# Patient Record
Sex: Male | Born: 1950 | Race: Black or African American | Hispanic: No | Marital: Single | Smoking: Never smoker
Health system: Southern US, Community
[De-identification: ages and names within clinical notes are randomized; demographics above are authoritative.]

## PROBLEM LIST (undated history)

## (undated) DIAGNOSIS — E119 Type 2 diabetes mellitus without complications: Secondary | ICD-10-CM

## (undated) DIAGNOSIS — Z789 Other specified health status: Secondary | ICD-10-CM

## (undated) DIAGNOSIS — M549 Dorsalgia, unspecified: Secondary | ICD-10-CM

## (undated) DIAGNOSIS — I10 Essential (primary) hypertension: Secondary | ICD-10-CM

## (undated) DIAGNOSIS — S069X9A Unspecified intracranial injury with loss of consciousness of unspecified duration, initial encounter: Secondary | ICD-10-CM

## (undated) HISTORY — DX: Essential (primary) hypertension: I10

## (undated) HISTORY — DX: Type 2 diabetes mellitus without complications: E11.9

---
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
## 2005-07-23 ENCOUNTER — Inpatient Hospital Stay (HOSPITAL_COMMUNITY): Admission: EM | Admit: 2005-07-23 | Discharge: 2005-07-28 | Payer: Self-pay | Admitting: Emergency Medicine

## 2008-08-14 ENCOUNTER — Emergency Department (HOSPITAL_COMMUNITY): Admission: EM | Admit: 2008-08-14 | Discharge: 2008-08-14 | Payer: Self-pay | Admitting: Emergency Medicine

## 2011-02-16 NOTE — Discharge Summary (Signed)
NAMERONEL, RODEHEAVER NO.:  [id number]   MEDICAL RECORD NO.:  [id number]          PATIENT TYPE:  INP   LOCATION:  3004                         FACILITY:  [facility]   PHYSICIAN:  Cherylynn Ridges, M.D.    DATE OF BIRTH:  [birth year]   DATE OF ADMISSION:  07/23/2005  DATE OF DISCHARGE:  07/28/2005                                 DISCHARGE SUMMARY   DISCHARGE DIAGNOSES:  1.  Blunt abdominal trauma.  2.  Grade 1 liver laceration.  3.  L2 left transverse process fracture.   CONSULTANTS:  None.   PROCEDURES:  None.   HISTORY OF PRESENT ILLNESS:  This is a 60 year old black male who got pinned  a wall and industrial washing machine while at work.  There was some period  of time before he was able to have the washing machine removed.  He comes in  as a non-trauma code complaining of abdominal pain.   WORKUP:  Workup demonstrated a grade 1 liver laceration with minimal free  fluid noted.  He also had an L2 transverse process fracture on the left  side.  He was admitted for pain control and observation of his hemoglobin  and hematocrit.  He was placed on bedrest.   HOSPITAL COURSE:  The patient did well in the hospital and his hemoglobin  remained stable.  He was able to be mobilized and ambulated without  assistance prior to discharge.  He was taking regular food at that point and  discharged in good condition to home.      Earney Hamburg, P.A.      Cherylynn Ridges, M.D.  Electronically Signed    MJ/MEDQ  D:  09/20/2005  T:  09/22/2005  Job:  [id number]

## 2011-07-03 LAB — CBC
HCT: 42.9
Hemoglobin: 14.3
MCHC: 33.3
MCV: 81.6
Platelets: 209
RBC: 5.26
RDW: 14.6
WBC: 4.3

## 2011-07-03 LAB — URINALYSIS, ROUTINE W REFLEX MICROSCOPIC
Bilirubin Urine: NEGATIVE
Glucose, UA: NEGATIVE
Hgb urine dipstick: NEGATIVE
Ketones, ur: NEGATIVE
Nitrite: NEGATIVE
Protein, ur: NEGATIVE
Specific Gravity, Urine: 1.019
Urobilinogen, UA: 0.2
pH: 5

## 2011-07-03 LAB — POCT I-STAT, CHEM 8
BUN: 10
Calcium, Ion: 1.11 — ABNORMAL LOW
Chloride: 106
Creatinine, Ser: 1.4
Glucose, Bld: 118 — ABNORMAL HIGH
HCT: 44
Hemoglobin: 15
Potassium: 4.2
Sodium: 140
TCO2: 26

## 2011-07-03 LAB — DIFFERENTIAL
Basophils Absolute: 0
Basophils Relative: 0
Eosinophils Absolute: 0.2
Eosinophils Relative: 4
Lymphocytes Relative: 34
Lymphs Abs: 1.5
Monocytes Absolute: 0.2
Monocytes Relative: 6
Neutro Abs: 2.4
Neutrophils Relative %: 56

## 2014-02-10 ENCOUNTER — Emergency Department (HOSPITAL_COMMUNITY): Payer: Worker's Compensation

## 2014-02-10 ENCOUNTER — Encounter (HOSPITAL_COMMUNITY): Payer: Self-pay | Admitting: Emergency Medicine

## 2014-02-10 ENCOUNTER — Emergency Department (HOSPITAL_COMMUNITY)
Admission: EM | Admit: 2014-02-10 | Discharge: 2014-02-10 | Disposition: A | Discharge status: Home / Self Care | Payer: Worker's Compensation | Attending: Emergency Medicine | Admitting: Emergency Medicine

## 2014-02-10 DIAGNOSIS — S46909A Unspecified injury of unspecified muscle, fascia and tendon at shoulder and upper arm level, unspecified arm, initial encounter: Secondary | ICD-10-CM | POA: Insufficient documentation

## 2014-02-10 DIAGNOSIS — Y990 Civilian activity done for income or pay: Secondary | ICD-10-CM | POA: Insufficient documentation

## 2014-02-10 DIAGNOSIS — W010XXA Fall on same level from slipping, tripping and stumbling without subsequent striking against object, initial encounter: Secondary | ICD-10-CM | POA: Insufficient documentation

## 2014-02-10 DIAGNOSIS — Y9389 Activity, other specified: Secondary | ICD-10-CM | POA: Insufficient documentation

## 2014-02-10 DIAGNOSIS — Y9289 Other specified places as the place of occurrence of the external cause: Secondary | ICD-10-CM | POA: Insufficient documentation

## 2014-02-10 DIAGNOSIS — S4980XA Other specified injuries of shoulder and upper arm, unspecified arm, initial encounter: Secondary | ICD-10-CM | POA: Insufficient documentation

## 2014-02-10 DIAGNOSIS — M25512 Pain in left shoulder: Secondary | ICD-10-CM

## 2014-02-10 HISTORY — DX: Dorsalgia, unspecified: M54.9

## 2014-02-10 MED ORDER — MELOXICAM 7.5 MG PO TABS: 15.0000 mg | ORAL_TABLET | Freq: Every day | ORAL | Status: DC

## 2014-02-10 MED ORDER — KETOROLAC TROMETHAMINE 60 MG/2ML IM SOLN
60.0000 mg | Freq: Once | INTRAMUSCULAR | Status: DC
  Filled 2014-02-10: qty 2

## 2014-02-10 MED ORDER — HYDROCODONE-ACETAMINOPHEN 5-325 MG PO TABS: 1.0000 | ORAL_TABLET | Freq: Four times a day (QID) | ORAL | Status: DC | PRN

## 2014-02-10 NOTE — ED Notes (Signed)
Pt states that he injured his L shoulder today at work. Was seen at [facility] and was told to come here because he had a "knot" on his shoulder. Alert and oriented.

## 2014-02-10 NOTE — ED Provider Notes (Signed)
CSN: [id number]     Arrival date & time 02/10/14  1517 History  This chart was scribed for non-physician practitioner, Junius FinnerErin O'Malley, PA-C working with Celene KrasJon R Knapp, MD by Luisa DagoPriscilla Tutu, ED scribe. This patient was seen in room WTR7/WTR7 and the patient's care was started at 4:07 PM.    Chief Complaint  Patient presents with  . Shoulder Injury   The history is provided by the patient. No language interpreter was used.   HPI Comments: Brandon Sullivan is a 63 y.o. male who presents to the Emergency Department complaining of an injury that occurred today at work. He states that he tripped over something and fell unto his left side. Pt states that he was seen by [facility] earlier who told him to come to the ED to be rechecked. He [facility]. Pt is right hand dominant. He states that he is still experiencing pain which he describes it as feeling "sore". He is currently in a sling. Denies any medicinal allergies, LOC, or head trauma. No pain medication PTA.  [facility] was consulted to confirm recheck of pt.  [facility] stated they had limited views with plain film and were concerned for subluxation of left shoulder.  Past Medical History  Diagnosis Date  . Back pain    History reviewed. No pertinent past surgical history. No family history on file. History  Substance Use Topics  . Smoking status: Never Smoker   . Smokeless tobacco: Not on file  . Alcohol Use: Yes    Review of Systems  Musculoskeletal: Positive for arthralgias (left shoulder pain).  All other systems reviewed and are negative.  Allergies  Review of patient's allergies indicates no known allergies.  Home Medications   Prior to Admission medications   Not on File   Triage vitals:BP 169/100  Pulse 103  Temp(Src) 99.1 F (37.3 C) (Oral)  SpO2 98%  Physical Exam  Nursing note and vitals reviewed. Constitutional: He appears well-developed and well-nourished.  HENT:   Head: Normocephalic and atraumatic.  Eyes: Conjunctivae are normal. No scleral icterus.  Neck: Normal range of motion.  Cardiovascular: Normal rate, regular rhythm and normal heart sounds.   Pulmonary/Chest: Effort normal and breath sounds normal. No respiratory distress. He has no wheezes. He has no rales. He exhibits no tenderness.  Musculoskeletal: Normal range of motion. He exhibits tenderness.  Mild deformity of left anterior shoulder with tenderness to palpation. Decrease abduction of left shoulder to 45 degrees. FROM left elbow without tenderness. 5/5 grip strength bilaterally.   Neurological: He is alert.  Skin: Skin is warm and dry.    ED Course  Procedures (including critical care time)  DIAGNOSTIC STUDIES: Oxygen Saturation is 98% on RA, normal by my interpretation.    COORDINATION OF CARE: 4:13 PM- Will order X-Ray of left shoulder. Pt advised of plan for treatment and pt agrees.  Imaging Review Dg Shoulder Left  02/10/2014   CLINICAL DATA:  Left shoulder pain.  EXAM: LEFT SHOULDER - 2+ VIEW  COMPARISON:  07/23/2005  FINDINGS: Left shoulder is located. The visualized left ribs are intact. No evidence for an acute fracture. [facility].  IMPRESSION: No acute bone abnormality to the left shoulder.   Electronically Signed   By: Richarda OverlieAdam  Henn M.D.   On: 02/10/2014 16:44   MDM   Final diagnoses:  Left shoulder pain  Fall due to stumbling    Pt c/o left shoulder pain, mild  deformity and limited ROM due to pain. Left arm is neurovascuarlly in tact. Repeat plain films of left shoulder-no acute bone abnormality to left shoulder. No evidence of acute fracture or subluxation.  Will tx as sprain. Pt may have injury to rotator cuff. Advised pt to continue using sling. [facility] for further evaluation and tx of left shoulder if not improving in 1-2 weeks. Return precautions provided. Pt verbalized understanding and agreement with tx  plan.   I personally performed the services described in this documentation, which was scribed in my presence. The recorded information has been reviewed and is accurate.     Junius Finnerrin O'Malley, PA-C 02/10/14 1705

## 2014-02-10 NOTE — Discharge Instructions (Signed)
Acromioclavicular Injuries °The acromioclavicular (AC) joint is the joint in the shoulder. There are many bands of tissue (ligaments) that surround the AC bones and joints. These bands of tissue can tear, which can lead to sprains and separations. The bones of the AC joint can also break (fracture).  °HOME CARE  °· Put ice on the injured area. °· Put ice in a plastic bag. °· Place a towel between your skin and the bag. °· Leave the ice on for 15-20 minutes, 03-04 times a day. °· Wear your sling as told by your doctor. Remove the sling before showering and bathing. Keep the shoulder in the same place as when the sling is on. Do not lift the arm. °· Gently tighten your figure-eight splint (if applied) every day. Tighten it enough to keep the shoulders held back. There should be room to place your finger between your body and the strap. Loosen the splint right away if you lose feeling (numbness) or have tingling in your hands. °· Only take medicine as told by your doctor. °· Keep all follow-up visits with your doctor. °GET HELP RIGHT AWAY IF:  °· Your medicine does not help your pain. °· You have more puffiness (swelling) or your bruising gets worse rather than better. °· You were unable to follow up as told by your doctor. °· You have tingling or lose even more feeling in your arm, forearm, or hand. °· Your arm is cold or pale. °· You have more pain in the hand, forearm, or fingers. °MAKE SURE YOU:  °· Understand these instructions. °· Will watch your condition. °· Will get help right away if you are not doing well or get worse. °Document Released: 03/07/2010 Document Revised: 12/10/2011 Document Reviewed: 03/07/2010 °[employer]® Patient Information ©2014 [employer]. ° °

## 2014-02-11 NOTE — ED Provider Notes (Signed)
Medical screening examination/treatment/procedure(s) were performed by non-physician practitioner and as supervising physician I was immediately available for consultation/collaboration.   Celene KrasJon R Adith Tejada, MD 02/11/14 [phone]

## 2014-09-15 ENCOUNTER — Emergency Department (HOSPITAL_COMMUNITY)
Admission: EM | Admit: 2014-09-15 | Discharge: 2014-09-15 | Disposition: A | Discharge status: Home / Self Care | Payer: BC Managed Care – HMO | Attending: Emergency Medicine | Admitting: Emergency Medicine

## 2014-09-15 ENCOUNTER — Encounter (HOSPITAL_COMMUNITY): Payer: Self-pay | Admitting: Emergency Medicine

## 2014-09-15 DIAGNOSIS — Z791 Long term (current) use of non-steroidal anti-inflammatories (NSAID): Secondary | ICD-10-CM | POA: Diagnosis not present

## 2014-09-15 DIAGNOSIS — Y9289 Other specified places as the place of occurrence of the external cause: Secondary | ICD-10-CM | POA: Insufficient documentation

## 2014-09-15 DIAGNOSIS — Z79899 Other long term (current) drug therapy: Secondary | ICD-10-CM | POA: Diagnosis not present

## 2014-09-15 DIAGNOSIS — X58XXXA Exposure to other specified factors, initial encounter: Secondary | ICD-10-CM | POA: Insufficient documentation

## 2014-09-15 DIAGNOSIS — Y9389 Activity, other specified: Secondary | ICD-10-CM | POA: Diagnosis not present

## 2014-09-15 DIAGNOSIS — Y990 Civilian activity done for income or pay: Secondary | ICD-10-CM | POA: Diagnosis not present

## 2014-09-15 DIAGNOSIS — M5412 Radiculopathy, cervical region: Secondary | ICD-10-CM | POA: Diagnosis not present

## 2014-09-15 DIAGNOSIS — S199XXA Unspecified injury of neck, initial encounter: Secondary | ICD-10-CM | POA: Diagnosis present

## 2014-09-15 MED ORDER — METHOCARBAMOL 500 MG PO TABS: 500.0000 mg | ORAL_TABLET | Freq: Two times a day (BID) | ORAL | Status: DC

## 2014-09-15 MED ORDER — PREDNISONE 10 MG PO TABS: 20.0000 mg | ORAL_TABLET | Freq: Every day | ORAL | Status: DC

## 2014-09-15 NOTE — Discharge Instructions (Signed)

## 2014-09-15 NOTE — ED Notes (Signed)
Bed: [facility] Expected date:  Expected time:  Means of arrival:  Comments: 

## 2014-09-15 NOTE — ED Provider Notes (Signed)
CSN: [id number]     Arrival date & time 09/15/14  1024 History   First MD Initiated Contact with Patient 09/15/14 1117     Chief Complaint  Patient presents with  . Neck Pain     (Consider location/radiation/quality/duration/timing/severity/associated sxs/prior Treatment) HPI Comments: Patient here complaining of left-sided neck pain 3 days. Pain started after he lifted a heavy object at work. Pain shoots down from his neck down his arm. Denies any anginal type symptoms. Symptoms characterized as sharp and worse with movement. They're better with rest. No treatment used prior to arrival. Denies any prior history of degenerative disc disease.  Patient is a 63 y.o. male presenting with neck pain. The history is provided by the patient.  Neck Pain   Past Medical History  Diagnosis Date  . Back pain    History reviewed. No pertinent past surgical history. No family history on file. History  Substance Use Topics  . Smoking status: Never Smoker   . Smokeless tobacco: Not on file  . Alcohol Use: Yes    Review of Systems  Musculoskeletal: Positive for neck pain.  All other systems reviewed and are negative.     Allergies  Review of patient's allergies indicates no known allergies.  Home Medications   Prior to Admission medications   Medication Sig Start Date End Date Taking? Authorizing Provider  HYDROcodone-acetaminophen (NORCO/VICODIN) 5-325 MG per tablet Take 1-2 tablets by mouth every 6 (six) hours as needed. 02/10/14   Junius FinnerErin O'Malley, PA-C  meloxicam (MOBIC) 7.5 MG tablet Take 2 tablets (15 mg total) by mouth daily. 02/10/14   Junius FinnerErin O'Malley, PA-C  methocarbamol (ROBAXIN) 500 MG tablet Take 1 tablet (500 mg total) by mouth 2 (two) times daily. 09/15/14   Toy BakerAnthony T Berkeley Vanaken, MD  predniSONE (DELTASONE) 10 MG tablet Take 2 tablets (20 mg total) by mouth daily. 09/15/14   Toy BakerAnthony T Ladajah Soltys, MD   BP 167/78 mmHg  Pulse 83  Temp(Src) 97.7 F (36.5 C) (Oral)  Resp 16  SpO2  100% Physical Exam  Constitutional: He is oriented to person, place, and time. He appears well-developed and well-nourished.  Non-toxic appearance. No distress.  HENT:  Head: Normocephalic and atraumatic.  Eyes: Conjunctivae, EOM and lids are normal. Pupils are equal, round, and reactive to light.  Neck: Normal range of motion. Neck supple. Muscular tenderness present. No tracheal deviation present. No thyroid mass present.    Cardiovascular: Normal rate, regular rhythm and normal heart sounds.  Exam reveals no gallop.   No murmur heard. Pulmonary/Chest: Effort normal and breath sounds normal. No stridor. No respiratory distress. He has no decreased breath sounds. He has no wheezes. He has no rhonchi. He has no rales.  Abdominal: Soft. Normal appearance and bowel sounds are normal. He exhibits no distension. There is no tenderness. There is no rebound and no CVA tenderness.  Musculoskeletal: Normal range of motion. He exhibits no edema or tenderness.  Neurological: He is alert and oriented to person, place, and time. He has normal strength. No cranial nerve deficit or sensory deficit. GCS eye subscore is 4. GCS verbal subscore is 5. GCS motor subscore is 6.  Median ulnar and radial nerves intact  Skin: Skin is warm and dry. No abrasion and no rash noted.  Psychiatric: He has a normal mood and affect. His speech is normal and behavior is normal.  Nursing note and vitals reviewed.   ED Course  Procedures (including critical care time) Labs Review Labs Reviewed - No data to  display  Imaging Review No results found.   EKG Interpretation None      MDM   Final diagnoses:  Cervical radiculopathy    Patient with likely cervical radiculopathy. Stable for discharge    Toy BakerAnthony T Hershey Knauer, MD 09/15/14 [phone]

## 2014-09-15 NOTE — ED Notes (Signed)
Per pt, states left neck pain that radiates down left arm, states injury form work

## 2018-12-31 DIAGNOSIS — S069X9A Unspecified intracranial injury with loss of consciousness of unspecified duration, initial encounter: Secondary | ICD-10-CM

## 2018-12-31 DIAGNOSIS — S069XAA Unspecified intracranial injury with loss of consciousness status unknown, initial encounter: Secondary | ICD-10-CM

## 2018-12-31 HISTORY — DX: Unspecified intracranial injury with loss of consciousness of unspecified duration, initial encounter: S06.9X9A

## 2018-12-31 HISTORY — DX: Unspecified intracranial injury with loss of consciousness status unknown, initial encounter: S06.9XAA

## 2019-01-26 ENCOUNTER — Emergency Department (HOSPITAL_COMMUNITY): Payer: Managed Care, Other (non HMO)

## 2019-01-26 ENCOUNTER — Encounter (HOSPITAL_COMMUNITY): Payer: Self-pay

## 2019-01-26 ENCOUNTER — Inpatient Hospital Stay (HOSPITAL_COMMUNITY)
Admission: EM | Admit: 2019-01-26 | Discharge: 2019-02-10 | DRG: 083 | Disposition: A | Discharge status: Skilled Nursing Facility | Payer: Managed Care, Other (non HMO) | Attending: General Surgery | Admitting: General Surgery

## 2019-01-26 DIAGNOSIS — Y901 Blood alcohol level of 20-39 mg/100 ml: Secondary | ICD-10-CM | POA: Diagnosis present

## 2019-01-26 DIAGNOSIS — H5010 Unspecified exotropia: Secondary | ICD-10-CM | POA: Diagnosis present

## 2019-01-26 DIAGNOSIS — S065X9A Traumatic subdural hemorrhage with loss of consciousness of unspecified duration, initial encounter: Secondary | ICD-10-CM | POA: Diagnosis present

## 2019-01-26 DIAGNOSIS — S1093XA Contusion of unspecified part of neck, initial encounter: Secondary | ICD-10-CM | POA: Diagnosis present

## 2019-01-26 DIAGNOSIS — F10129 Alcohol abuse with intoxication, unspecified: Secondary | ICD-10-CM | POA: Diagnosis present

## 2019-01-26 DIAGNOSIS — R40243 Glasgow coma scale score 3-8, unspecified time: Secondary | ICD-10-CM | POA: Diagnosis present

## 2019-01-26 DIAGNOSIS — Z781 Physical restraint status: Secondary | ICD-10-CM | POA: Diagnosis not present

## 2019-01-26 DIAGNOSIS — Z20828 Contact with and (suspected) exposure to other viral communicable diseases: Secondary | ICD-10-CM | POA: Diagnosis present

## 2019-01-26 DIAGNOSIS — Z1159 Encounter for screening for other viral diseases: Secondary | ICD-10-CM | POA: Diagnosis not present

## 2019-01-26 DIAGNOSIS — R000 Tachycardia, unspecified: Secondary | ICD-10-CM | POA: Diagnosis present

## 2019-01-26 DIAGNOSIS — R451 Restlessness and agitation: Secondary | ICD-10-CM | POA: Diagnosis present

## 2019-01-26 DIAGNOSIS — R4701 Aphasia: Secondary | ICD-10-CM | POA: Diagnosis present

## 2019-01-26 DIAGNOSIS — S60512A Abrasion of left hand, initial encounter: Secondary | ICD-10-CM | POA: Diagnosis present

## 2019-01-26 DIAGNOSIS — S0083XA Contusion of other part of head, initial encounter: Secondary | ICD-10-CM | POA: Diagnosis present

## 2019-01-26 DIAGNOSIS — S80211A Abrasion, right knee, initial encounter: Secondary | ICD-10-CM | POA: Diagnosis present

## 2019-01-26 DIAGNOSIS — S80212A Abrasion, left knee, initial encounter: Secondary | ICD-10-CM | POA: Diagnosis present

## 2019-01-26 DIAGNOSIS — R410 Disorientation, unspecified: Secondary | ICD-10-CM | POA: Diagnosis present

## 2019-01-26 DIAGNOSIS — I10 Essential (primary) hypertension: Secondary | ICD-10-CM | POA: Diagnosis present

## 2019-01-26 DIAGNOSIS — S60511A Abrasion of right hand, initial encounter: Secondary | ICD-10-CM | POA: Diagnosis present

## 2019-01-26 DIAGNOSIS — S066X9A Traumatic subarachnoid hemorrhage with loss of consciousness of unspecified duration, initial encounter: Secondary | ICD-10-CM | POA: Diagnosis present

## 2019-01-26 HISTORY — DX: Other specified health status: Z78.9

## 2019-01-26 LAB — ABO/RH: ABO/RH(D): A POS

## 2019-01-26 LAB — BPAM FFP
Blood Product Expiration Date: 202005092359
Blood Product Expiration Date: 202005172359
ISSUE DATE / TIME: 202004271010
ISSUE DATE / TIME: 202004271010
Unit Type and Rh: 600
Unit Type and Rh: 6200

## 2019-01-26 LAB — CBC
HCT: 44.9 % (ref 39.0–52.0)
Hemoglobin: 15.3 g/dL (ref 13.0–17.0)
MCH: 27.1 pg (ref 26.0–34.0)
MCHC: 34.1 g/dL (ref 30.0–36.0)
MCV: 79.5 fL — ABNORMAL LOW (ref 80.0–100.0)
Platelets: 200 10*3/uL (ref 150–400)
RBC: 5.65 MIL/uL (ref 4.22–5.81)
RDW: 12.3 % (ref 11.5–15.5)
WBC: 7.4 10*3/uL (ref 4.0–10.5)
nRBC: 0 % (ref 0.0–0.2)

## 2019-01-26 LAB — RAPID URINE DRUG SCREEN, HOSP PERFORMED
Amphetamines: NOT DETECTED
Barbiturates: NOT DETECTED
Benzodiazepines: POSITIVE — AB
Cocaine: NOT DETECTED
Opiates: NOT DETECTED
Tetrahydrocannabinol: NOT DETECTED

## 2019-01-26 LAB — PREPARE FRESH FROZEN PLASMA
Unit division: 0
Unit division: 0

## 2019-01-26 LAB — MRSA PCR SCREENING: MRSA by PCR: NEGATIVE

## 2019-01-26 LAB — COMPREHENSIVE METABOLIC PANEL
ALT: 38 U/L (ref 0–44)
AST: 58 U/L — ABNORMAL HIGH (ref 15–41)
Albumin: 3.7 g/dL (ref 3.5–5.0)
Alkaline Phosphatase: 78 U/L (ref 38–126)
Anion gap: 14 (ref 5–15)
BUN: 13 mg/dL (ref 8–23)
CO2: 19 mmol/L — ABNORMAL LOW (ref 22–32)
Calcium: 9.1 mg/dL (ref 8.9–10.3)
Chloride: 101 mmol/L (ref 98–111)
Creatinine, Ser: 1.3 mg/dL — ABNORMAL HIGH (ref 0.61–1.24)
GFR calc Af Amer: >60 mL/min (ref >60)
GFR calc non Af Amer: 56 mL/min — ABNORMAL LOW (ref >60)
Glucose, Bld: 360 mg/dL — ABNORMAL HIGH (ref 70–99)
Potassium: 4.6 mmol/L (ref 3.5–5.1)
Sodium: 134 mmol/L — ABNORMAL LOW (ref 135–145)
Total Bilirubin: 1.1 mg/dL (ref 0.3–1.2)
Total Protein: 6.9 g/dL (ref 6.5–8.1)

## 2019-01-26 LAB — GLUCOSE, CAPILLARY
Glucose-Capillary: 325 mg/dL — ABNORMAL HIGH (ref 70–99)
Glucose-Capillary: 334 mg/dL — ABNORMAL HIGH (ref 70–99)
Glucose-Capillary: 301 mg/dL — ABNORMAL HIGH (ref 70–99)
Glucose-Capillary: 204 mg/dL — ABNORMAL HIGH (ref 70–99)

## 2019-01-26 LAB — PROTIME-INR
INR: 1 (ref 0.8–1.2)
Prothrombin Time: 13.1 seconds (ref 11.4–15.2)

## 2019-01-26 LAB — URINALYSIS, ROUTINE W REFLEX MICROSCOPIC
Bilirubin Urine: NEGATIVE
Glucose, UA: >=500 mg/dL — AB
Ketones, ur: NEGATIVE mg/dL
Leukocytes,Ua: NEGATIVE
Nitrite: NEGATIVE
Protein, ur: NEGATIVE mg/dL
Specific Gravity, Urine: 1.03 (ref 1.005–1.030)
pH: 5 (ref 5.0–8.0)

## 2019-01-26 LAB — SARS CORONAVIRUS 2 BY RT PCR (HOSPITAL ORDER, PERFORMED IN ~~LOC~~ HOSPITAL LAB): SARS Coronavirus 2: NEGATIVE

## 2019-01-26 LAB — LACTIC ACID, PLASMA: Lactic Acid, Venous: 2.8 mmol/L (ref 0.5–1.9)

## 2019-01-26 LAB — ETHANOL: Alcohol, Ethyl (B): 32 mg/dL — ABNORMAL HIGH (ref <10)

## 2019-01-26 LAB — CDS SEROLOGY

## 2019-01-26 MED ORDER — BACITRACIN ZINC 500 UNIT/GM EX OINT
TOPICAL_OINTMENT | Freq: Two times a day (BID) | CUTANEOUS | Status: DC
  Administered 2019-01-26 – 2019-01-27 (×3): via TOPICAL
  Administered 2019-01-27 – 2019-01-28 (×3): 1 via TOPICAL
  Administered 2019-01-29 – 2019-02-01 (×8): via TOPICAL
  Administered 2019-02-02 (×2): 1 via TOPICAL
  Administered 2019-02-03 – 2019-02-06 (×7): via TOPICAL
  Administered 2019-02-06: 1 via TOPICAL
  Administered 2019-02-07 – 2019-02-10 (×6): via TOPICAL
  Filled 2019-01-26 (×3): qty 28.4

## 2019-01-26 MED ORDER — MIDAZOLAM HCL 2 MG/2ML IJ SOLN
INTRAMUSCULAR | Status: AC
  Filled 2019-01-26: qty 2

## 2019-01-26 MED ORDER — HYDRALAZINE HCL 20 MG/ML IJ SOLN
10.0000 mg | INTRAMUSCULAR | Status: AC | PRN
  Administered 2019-01-27 (×4): 10 mg via INTRAVENOUS
  Filled 2019-01-26 (×3): qty 1

## 2019-01-26 MED ORDER — LORAZEPAM 2 MG/ML IJ SOLN
0.0000 mg | INTRAMUSCULAR | Status: AC
  Administered 2019-01-26: 1 mg via INTRAVENOUS
  Administered 2019-01-27 (×4): 2 mg via INTRAVENOUS
  Administered 2019-01-27 – 2019-01-28 (×2): 1 mg via INTRAVENOUS
  Administered 2019-01-28 (×3): 2 mg via INTRAVENOUS
  Administered 2019-01-28 (×2): 1 mg via INTRAVENOUS
  Administered 2019-01-28: 2 mg via INTRAVENOUS
  Administered 2019-01-29 (×4): 1 mg via INTRAVENOUS
  Administered 2019-01-30 (×4): 2 mg via INTRAVENOUS
  Administered 2019-01-30 (×2): 1 mg via INTRAVENOUS
  Administered 2019-01-31: 2 mg via INTRAVENOUS
  Filled 2019-01-26 (×24): qty 1

## 2019-01-26 MED ORDER — FOLIC ACID 1 MG PO TABS
1.0000 mg | ORAL_TABLET | Freq: Every day | ORAL | Status: DC
  Administered 2019-01-27 – 2019-02-10 (×10): 1 mg via ORAL
  Filled 2019-01-26 (×10): qty 1

## 2019-01-26 MED ORDER — ACETAMINOPHEN 325 MG PO TABS: 650.0000 mg | ORAL_TABLET | Freq: Four times a day (QID) | ORAL | Status: DC | PRN

## 2019-01-26 MED ORDER — METOPROLOL TARTRATE 5 MG/5ML IV SOLN
INTRAVENOUS | Status: AC
  Filled 2019-01-26: qty 5

## 2019-01-26 MED ORDER — LORAZEPAM 1 MG PO TABS: 1.0000 mg | ORAL_TABLET | ORAL | Status: DC | PRN

## 2019-01-26 MED ORDER — MORPHINE SULFATE (PF) 2 MG/ML IV SOLN
1.0000 mg | INTRAVENOUS | Status: DC | PRN
  Administered 2019-01-27 – 2019-01-28 (×2): 1 mg via INTRAVENOUS
  Administered 2019-01-28 – 2019-02-09 (×4): 2 mg via INTRAVENOUS
  Filled 2019-01-26 (×6): qty 1

## 2019-01-26 MED ORDER — OXYCODONE HCL 5 MG PO TABS
5.0000 mg | ORAL_TABLET | ORAL | Status: DC | PRN
  Filled 2019-01-26: qty 1

## 2019-01-26 MED ORDER — LORAZEPAM 2 MG/ML IJ SOLN: 0.0000 mg | Freq: Two times a day (BID) | INTRAMUSCULAR | Status: DC

## 2019-01-26 MED ORDER — ONDANSETRON HCL 4 MG/2ML IJ SOLN: 4.0000 mg | Freq: Four times a day (QID) | INTRAMUSCULAR | Status: DC | PRN

## 2019-01-26 MED ORDER — ONDANSETRON 4 MG PO TBDP: 4.0000 mg | ORAL_TABLET | Freq: Four times a day (QID) | ORAL | Status: DC | PRN

## 2019-01-26 MED ORDER — METOPROLOL TARTRATE 5 MG/5ML IV SOLN
5.0000 mg | Freq: Four times a day (QID) | INTRAVENOUS | Status: DC | PRN
  Administered 2019-01-26 – 2019-01-30 (×11): 5 mg via INTRAVENOUS
  Filled 2019-01-26 (×8): qty 5

## 2019-01-26 MED ORDER — PANTOPRAZOLE SODIUM 40 MG IV SOLR
40.0000 mg | Freq: Every day | INTRAVENOUS | Status: DC
  Administered 2019-01-27 – 2019-02-02 (×6): 40 mg via INTRAVENOUS
  Filled 2019-01-26 (×7): qty 40

## 2019-01-26 MED ORDER — LORAZEPAM 1 MG PO TABS: 1.0000 mg | ORAL_TABLET | Freq: Four times a day (QID) | ORAL | Status: DC | PRN

## 2019-01-26 MED ORDER — CHLORHEXIDINE GLUCONATE 0.12 % MT SOLN
15.0000 mL | Freq: Two times a day (BID) | OROMUCOSAL | Status: DC
  Administered 2019-01-26 – 2019-02-10 (×27): 15 mL via OROMUCOSAL
  Filled 2019-01-26 (×23): qty 15

## 2019-01-26 MED ORDER — INSULIN ASPART 100 UNIT/ML ~~LOC~~ SOLN
0.0000 [IU] | SUBCUTANEOUS | Status: DC
  Administered 2019-01-26: 5 [IU] via SUBCUTANEOUS
  Administered 2019-01-26 (×2): 11 [IU] via SUBCUTANEOUS
  Administered 2019-01-27: 3 [IU] via SUBCUTANEOUS
  Administered 2019-01-27: 5 [IU] via SUBCUTANEOUS
  Administered 2019-01-27 (×2): 3 [IU] via SUBCUTANEOUS
  Administered 2019-01-27: 5 [IU] via SUBCUTANEOUS
  Administered 2019-01-27 – 2019-01-28 (×2): 3 [IU] via SUBCUTANEOUS
  Administered 2019-01-28: 08:00:00 2 [IU] via SUBCUTANEOUS
  Administered 2019-01-28: 3 [IU] via SUBCUTANEOUS
  Administered 2019-01-28: 2 [IU] via SUBCUTANEOUS
  Administered 2019-01-28 – 2019-01-29 (×6): 3 [IU] via SUBCUTANEOUS
  Administered 2019-01-29: 2 [IU] via SUBCUTANEOUS
  Administered 2019-01-29 – 2019-01-30 (×6): 3 [IU] via SUBCUTANEOUS
  Administered 2019-01-30: 2 [IU] via SUBCUTANEOUS
  Administered 2019-01-31: 3 [IU] via SUBCUTANEOUS
  Administered 2019-01-31: 5 [IU] via SUBCUTANEOUS
  Administered 2019-01-31: 8 [IU] via SUBCUTANEOUS
  Administered 2019-01-31 (×2): 11 [IU] via SUBCUTANEOUS
  Administered 2019-01-31: 3 [IU] via SUBCUTANEOUS
  Administered 2019-02-01: 5 [IU] via SUBCUTANEOUS
  Administered 2019-02-01: 3 [IU] via SUBCUTANEOUS
  Administered 2019-02-01 (×2): 5 [IU] via SUBCUTANEOUS
  Administered 2019-02-01: 11 [IU] via SUBCUTANEOUS
  Administered 2019-02-01 – 2019-02-02 (×5): 5 [IU] via SUBCUTANEOUS
  Administered 2019-02-02 – 2019-02-03 (×2): 3 [IU] via SUBCUTANEOUS
  Administered 2019-02-03 (×2): 5 [IU] via SUBCUTANEOUS
  Administered 2019-02-03: 3 [IU] via SUBCUTANEOUS
  Administered 2019-02-03: 8 [IU] via SUBCUTANEOUS
  Administered 2019-02-03: 3 [IU] via SUBCUTANEOUS
  Administered 2019-02-04: 2 [IU] via SUBCUTANEOUS
  Administered 2019-02-04: 3 [IU] via SUBCUTANEOUS
  Administered 2019-02-04 – 2019-02-05 (×4): 5 [IU] via SUBCUTANEOUS
  Administered 2019-02-05: 17:00:00 3 [IU] via SUBCUTANEOUS
  Administered 2019-02-05: 2 [IU] via SUBCUTANEOUS
  Administered 2019-02-05: 8 [IU] via SUBCUTANEOUS
  Administered 2019-02-06 (×2): 2 [IU] via SUBCUTANEOUS
  Administered 2019-02-06 (×2): 3 [IU] via SUBCUTANEOUS

## 2019-01-26 MED ORDER — THIAMINE HCL 100 MG/ML IJ SOLN
100.0000 mg | Freq: Every day | INTRAMUSCULAR | Status: DC
  Administered 2019-01-26 – 2019-02-02 (×6): 100 mg via INTRAVENOUS
  Filled 2019-01-26 (×6): qty 2

## 2019-01-26 MED ORDER — SODIUM CHLORIDE 0.9 % IV SOLN
INTRAVENOUS | Status: DC
  Administered 2019-01-26 – 2019-02-01 (×10): via INTRAVENOUS

## 2019-01-26 MED ORDER — VITAMIN B-1 100 MG PO TABS
100.0000 mg | ORAL_TABLET | Freq: Every day | ORAL | Status: DC
  Administered 2019-01-31 – 2019-02-10 (×9): 100 mg via ORAL
  Filled 2019-01-26 (×9): qty 1

## 2019-01-26 MED ORDER — LORAZEPAM 2 MG/ML IJ SOLN
INTRAMUSCULAR | Status: AC
  Filled 2019-01-26: qty 1

## 2019-01-26 MED ORDER — ADULT MULTIVITAMIN W/MINERALS CH
1.0000 | ORAL_TABLET | Freq: Every day | ORAL | Status: DC
  Administered 2019-01-31 – 2019-02-10 (×9): 1 via ORAL
  Filled 2019-01-26 (×11): qty 1

## 2019-01-26 MED ORDER — LORAZEPAM 2 MG/ML IJ SOLN
0.0000 mg | Freq: Four times a day (QID) | INTRAMUSCULAR | Status: DC
  Administered 2019-01-26: 2 mg via INTRAVENOUS

## 2019-01-26 MED ORDER — LORAZEPAM 2 MG/ML IJ SOLN
1.0000 mg | INTRAMUSCULAR | Status: DC | PRN
  Administered 2019-01-26 – 2019-02-01 (×5): 1 mg via INTRAVENOUS
  Filled 2019-01-26 (×6): qty 1

## 2019-01-26 MED ORDER — MIDAZOLAM HCL 5 MG/5ML IJ SOLN
INTRAMUSCULAR | Status: AC | PRN
  Administered 2019-01-26: 1 mg via INTRAVENOUS

## 2019-01-26 MED ORDER — IOHEXOL 300 MG/ML  SOLN
100.0000 mL | Freq: Once | INTRAMUSCULAR | Status: AC | PRN
  Administered 2019-01-26: 11:00:00 100 mL via INTRAVENOUS

## 2019-01-26 MED ORDER — LORAZEPAM 2 MG/ML IJ SOLN: 1.0000 mg | Freq: Four times a day (QID) | INTRAMUSCULAR | Status: DC | PRN

## 2019-01-26 MED ORDER — HYDRALAZINE HCL 20 MG/ML IJ SOLN: 10.0000 mg | INTRAMUSCULAR | Status: DC | PRN

## 2019-01-26 MED ORDER — PANTOPRAZOLE SODIUM 40 MG PO TBEC
40.0000 mg | DELAYED_RELEASE_TABLET | Freq: Every day | ORAL | Status: DC
  Administered 2019-02-03 – 2019-02-10 (×8): 40 mg via ORAL
  Filled 2019-01-26 (×8): qty 1

## 2019-01-26 MED ORDER — TETANUS-DIPHTH-ACELL PERTUSSIS 5-2.5-18.5 LF-MCG/0.5 IM SUSP
0.5000 mL | Freq: Once | INTRAMUSCULAR | Status: AC
  Administered 2019-01-26: 0.5 mL via INTRAMUSCULAR
  Filled 2019-01-26: qty 0.5

## 2019-01-26 NOTE — Progress Notes (Signed)
SLP Cancellation Note  Patient Details Name: Brandon Sullivan MRN: [id number] DOB: 04/28/1951   Cancelled treatment:       Reason Eval/Treat Not Completed: Other (comment) Orders received for swallow evaluation. Touched base with RN, who advises holding until next date. Will f/u for swallow and cognitive evaluations.    Virl Axe Yanelle Sousa 01/26/2019, 3:47 PM  Ivar Drape, M.A. CCC-SLP Acute Herbalist [phone] Office [phone]
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
## 2019-01-26 NOTE — ED Notes (Signed)
Pt returned from CT with nursing staff. Remains stable.
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
## 2019-01-26 NOTE — Progress Notes (Addendum)
Attempted to call contact listed for patient Brandon Sullivan x2, no answer. No answering service for me to leave a message.   Wells Guiles , [facility] 01/26/2019, 3:00 PM Pager: [phone]
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
## 2019-01-26 NOTE — ED Notes (Signed)
Attempted report 

## 2019-01-27 ENCOUNTER — Inpatient Hospital Stay (HOSPITAL_COMMUNITY): Payer: Managed Care, Other (non HMO)

## 2019-01-27 LAB — BPAM RBC
Blood Product Expiration Date: 202005102359
Blood Product Expiration Date: 202005102359
ISSUE DATE / TIME: 202004272326
ISSUE DATE / TIME: 202004272326
Unit Type and Rh: 5100
Unit Type and Rh: 5100

## 2019-01-27 LAB — TYPE AND SCREEN
ABO/RH(D): A POS
Antibody Screen: NEGATIVE
Unit division: 0
Unit division: 0

## 2019-01-27 LAB — GLUCOSE, CAPILLARY
Glucose-Capillary: 167 mg/dL — ABNORMAL HIGH (ref 70–99)
Glucose-Capillary: 180 mg/dL — ABNORMAL HIGH (ref 70–99)
Glucose-Capillary: 181 mg/dL — ABNORMAL HIGH (ref 70–99)
Glucose-Capillary: 182 mg/dL — ABNORMAL HIGH (ref 70–99)
Glucose-Capillary: 188 mg/dL — ABNORMAL HIGH (ref 70–99)
Glucose-Capillary: 202 mg/dL — ABNORMAL HIGH (ref 70–99)
Glucose-Capillary: 207 mg/dL — ABNORMAL HIGH (ref 70–99)

## 2019-01-27 LAB — COMPREHENSIVE METABOLIC PANEL
ALT: 50 U/L — ABNORMAL HIGH (ref 0–44)
AST: 97 U/L — ABNORMAL HIGH (ref 15–41)
Albumin: 3.2 g/dL — ABNORMAL LOW (ref 3.5–5.0)
Alkaline Phosphatase: 67 U/L (ref 38–126)
Anion gap: 11 (ref 5–15)
BUN: 12 mg/dL (ref 8–23)
CO2: 22 mmol/L (ref 22–32)
Calcium: 8.9 mg/dL (ref 8.9–10.3)
Chloride: 107 mmol/L (ref 98–111)
Creatinine, Ser: 0.99 mg/dL (ref 0.61–1.24)
GFR calc Af Amer: >60 mL/min (ref >60)
GFR calc non Af Amer: >60 mL/min (ref >60)
Glucose, Bld: 177 mg/dL — ABNORMAL HIGH (ref 70–99)
Potassium: 3.8 mmol/L (ref 3.5–5.1)
Sodium: 140 mmol/L (ref 135–145)
Total Bilirubin: 0.9 mg/dL (ref 0.3–1.2)
Total Protein: 6.2 g/dL — ABNORMAL LOW (ref 6.5–8.1)

## 2019-01-27 LAB — CBC
HCT: 37.2 % — ABNORMAL LOW (ref 39.0–52.0)
Hemoglobin: 12.7 g/dL — ABNORMAL LOW (ref 13.0–17.0)
MCH: 26.7 pg (ref 26.0–34.0)
MCHC: 34.1 g/dL (ref 30.0–36.0)
MCV: 78.2 fL — ABNORMAL LOW (ref 80.0–100.0)
Platelets: 159 10*3/uL (ref 150–400)
RBC: 4.76 MIL/uL (ref 4.22–5.81)
RDW: 12.2 % (ref 11.5–15.5)
WBC: 7 10*3/uL (ref 4.0–10.5)
nRBC: 0 % (ref 0.0–0.2)

## 2019-01-27 LAB — HIV ANTIBODY (ROUTINE TESTING W REFLEX): HIV Screen 4th Generation wRfx: NONREACTIVE

## 2019-01-27 LAB — BLOOD PRODUCT ORDER (VERBAL) VERIFICATION

## 2019-01-27 MED ORDER — MIDAZOLAM HCL 2 MG/2ML IJ SOLN: 2.0000 mg | Freq: Once | INTRAMUSCULAR | Status: DC
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
## 2019-01-27 NOTE — Progress Notes (Signed)
Pts HR in 130s. Per hand-off report @ 1600, pt has been in ST all day.  Pt does have order for lopressor PRN, however indicated for BP not HR.  Pt has recently received Ativan w/ no change in HR despite lying calmly in bed.  Spoke w/ Dr. Corliss Skains, Trauma who states ok to give pt ordered lopressor for HR.

## 2019-01-27 NOTE — Progress Notes (Signed)
Report given to Marchelle Folks, RN 4N. Pt to be transported by bed.
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
## 2019-01-28 LAB — GLUCOSE, CAPILLARY
Glucose-Capillary: 147 mg/dL — ABNORMAL HIGH (ref 70–99)
Glucose-Capillary: 147 mg/dL — ABNORMAL HIGH (ref 70–99)
Glucose-Capillary: 161 mg/dL — ABNORMAL HIGH (ref 70–99)
Glucose-Capillary: 178 mg/dL — ABNORMAL HIGH (ref 70–99)
Glucose-Capillary: 181 mg/dL — ABNORMAL HIGH (ref 70–99)
Glucose-Capillary: 191 mg/dL — ABNORMAL HIGH (ref 70–99)

## 2019-01-28 LAB — COMPREHENSIVE METABOLIC PANEL
ALT: 52 U/L — ABNORMAL HIGH (ref 0–44)
AST: 84 U/L — ABNORMAL HIGH (ref 15–41)
Albumin: 3.2 g/dL — ABNORMAL LOW (ref 3.5–5.0)
Alkaline Phosphatase: 63 U/L (ref 38–126)
Anion gap: 11 (ref 5–15)
BUN: 10 mg/dL (ref 8–23)
CO2: 23 mmol/L (ref 22–32)
Calcium: 8.9 mg/dL (ref 8.9–10.3)
Chloride: 109 mmol/L (ref 98–111)
Creatinine, Ser: 1.01 mg/dL (ref 0.61–1.24)
GFR calc Af Amer: >60 mL/min (ref >60)
GFR calc non Af Amer: >60 mL/min (ref >60)
Glucose, Bld: 180 mg/dL — ABNORMAL HIGH (ref 70–99)
Potassium: 3.7 mmol/L (ref 3.5–5.1)
Sodium: 143 mmol/L (ref 135–145)
Total Bilirubin: 1.1 mg/dL (ref 0.3–1.2)
Total Protein: 6.4 g/dL — ABNORMAL LOW (ref 6.5–8.1)

## 2019-01-28 LAB — CBC
HCT: 38 % — ABNORMAL LOW (ref 39.0–52.0)
Hemoglobin: 12.8 g/dL — ABNORMAL LOW (ref 13.0–17.0)
MCH: 26.9 pg (ref 26.0–34.0)
MCHC: 33.7 g/dL (ref 30.0–36.0)
MCV: 79.8 fL — ABNORMAL LOW (ref 80.0–100.0)
Platelets: 164 10*3/uL (ref 150–400)
RBC: 4.76 MIL/uL (ref 4.22–5.81)
RDW: 12.7 % (ref 11.5–15.5)
WBC: 7.1 10*3/uL (ref 4.0–10.5)
nRBC: 0 % (ref 0.0–0.2)
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
## 2019-01-29 LAB — GLUCOSE, CAPILLARY
Glucose-Capillary: 146 mg/dL — ABNORMAL HIGH (ref 70–99)
Glucose-Capillary: 158 mg/dL — ABNORMAL HIGH (ref 70–99)
Glucose-Capillary: 177 mg/dL — ABNORMAL HIGH (ref 70–99)
Glucose-Capillary: 160 mg/dL — ABNORMAL HIGH (ref 70–99)
Glucose-Capillary: 167 mg/dL — ABNORMAL HIGH (ref 70–99)

## 2019-01-29 MED ORDER — METOPROLOL TARTRATE 5 MG/5ML IV SOLN
5.0000 mg | Freq: Four times a day (QID) | INTRAVENOUS | Status: DC
  Administered 2019-01-29 – 2019-02-01 (×12): 5 mg via INTRAVENOUS
  Filled 2019-01-29 (×11): qty 5

## 2019-01-29 MED ORDER — ENOXAPARIN SODIUM 40 MG/0.4ML ~~LOC~~ SOLN
40.0000 mg | SUBCUTANEOUS | Status: DC
  Administered 2019-01-29 – 2019-02-10 (×13): 40 mg via SUBCUTANEOUS
  Filled 2019-01-29 (×12): qty 0.4
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
## 2019-01-29 NOTE — Progress Notes (Signed)
Called and updated patient's brother Vard Wayner on patient's condition. Mr Kuramoto stated they also have a sister, Babette Relic, she is going to call up to the unit and leave her phone number as well. Mr Shangraw reports that they do not see the patient all that often outside of holidays. 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  Wells Guiles , [facility] Surgery 01/29/2019, 2:37 PM Pager: [phone]
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
## 2019-01-30 LAB — BASIC METABOLIC PANEL
Anion gap: 16 — ABNORMAL HIGH (ref 5–15)
BUN: 13 mg/dL (ref 8–23)
CO2: 15 mmol/L — ABNORMAL LOW (ref 22–32)
Calcium: 8.8 mg/dL — ABNORMAL LOW (ref 8.9–10.3)
Chloride: 113 mmol/L — ABNORMAL HIGH (ref 98–111)
Creatinine, Ser: 0.9 mg/dL (ref 0.61–1.24)
GFR calc Af Amer: >60 mL/min (ref >60)
GFR calc non Af Amer: >60 mL/min (ref >60)
Glucose, Bld: 177 mg/dL — ABNORMAL HIGH (ref 70–99)
Potassium: 4 mmol/L (ref 3.5–5.1)
Sodium: 144 mmol/L (ref 135–145)

## 2019-01-30 LAB — GLUCOSE, CAPILLARY
Glucose-Capillary: 144 mg/dL — ABNORMAL HIGH (ref 70–99)
Glucose-Capillary: 151 mg/dL — ABNORMAL HIGH (ref 70–99)
Glucose-Capillary: 156 mg/dL — ABNORMAL HIGH (ref 70–99)
Glucose-Capillary: 168 mg/dL — ABNORMAL HIGH (ref 70–99)
Glucose-Capillary: 173 mg/dL — ABNORMAL HIGH (ref 70–99)
Glucose-Capillary: 176 mg/dL — ABNORMAL HIGH (ref 70–99)

## 2019-01-30 MED ORDER — OSMOLITE 1.5 CAL PO LIQD
474.0000 mL | Freq: Three times a day (TID) | ORAL | Status: DC
  Administered 2019-01-30: 22:00:00 474 mL
  Administered 2019-01-30: 120 mL
  Administered 2019-01-31 (×3): 474 mL
  Filled 2019-01-30 (×12): qty 474

## 2019-01-30 MED ORDER — HYDRALAZINE HCL 20 MG/ML IJ SOLN
5.0000 mg | Freq: Four times a day (QID) | INTRAMUSCULAR | Status: DC | PRN
  Administered 2019-01-30 – 2019-02-01 (×4): 5 mg via INTRAVENOUS
  Filled 2019-01-30 (×4): qty 1

## 2019-01-30 MED ORDER — JEVITY 1.2 CAL PO LIQD: 1000.0000 mL | ORAL | Status: DC

## 2019-01-30 MED ORDER — PRO-STAT SUGAR FREE PO LIQD
30.0000 mL | Freq: Every day | ORAL | Status: DC
  Administered 2019-01-31: 30 mL
  Filled 2019-01-30: qty 30

## 2019-01-30 NOTE — Procedures (Signed)
Cortrak  Person Inserting Tube:  Sayyid Harewood C, RD Tube Type:  Cortrak - 43 inches Tube Location:  Left nare Initial Placement:  Stomach Secured by: Bridle Technique Used to Measure Tube Placement:  Documented cm marking at nare/ corner of mouth Cortrak Secured At:  67 cm    Cortrak Tube Team Note:  Consult received to place a Cortrak feeding tube.   No x-ray is required. RN may begin using tube.   If the tube becomes dislodged please keep the tube and contact the Cortrak team at [email or web address] (password [email or web address]) for replacement.  If after hours and replacement cannot be delayed, place a NG tube and confirm placement with an abdominal x-ray.    Anselmo Reihl RD, LDN, CNSC [phone] Pager [phone] After Hours Pager   
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
## 2019-01-30 NOTE — Progress Notes (Signed)
Pharmacy is unable to confirm the medications the patient was taking at home. All options have been exhausted and a resolution to the situation is not expected.   Where possible, their outpatient pharmacy(s) have been contacted for the last time prescriptions were filled and that information has been added to each medication in an Order Note (highlighted yellow below the medication).  Please contact pharmacy if further assistance is needed.   Charolotte Eke, PharmD. Mobile: [phone]. 01/30/2019,8:08 AM.
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
## 2019-01-31 LAB — BASIC METABOLIC PANEL
Anion gap: 9 (ref 5–15)
BUN: 16 mg/dL (ref 8–23)
CO2: 22 mmol/L (ref 22–32)
Calcium: 9.1 mg/dL (ref 8.9–10.3)
Chloride: 115 mmol/L — ABNORMAL HIGH (ref 98–111)
Creatinine, Ser: 0.99 mg/dL (ref 0.61–1.24)
Glucose, Bld: 236 mg/dL — ABNORMAL HIGH (ref 70–99)
Potassium: 3.6 mmol/L (ref 3.5–5.1)
Sodium: 146 mmol/L — ABNORMAL HIGH (ref 135–145)

## 2019-01-31 LAB — GLUCOSE, CAPILLARY
Glucose-Capillary: 200 mg/dL — ABNORMAL HIGH (ref 70–99)
Glucose-Capillary: 208 mg/dL — ABNORMAL HIGH (ref 70–99)
Glucose-Capillary: 257 mg/dL — ABNORMAL HIGH (ref 70–99)
Glucose-Capillary: 307 mg/dL — ABNORMAL HIGH (ref 70–99)
Glucose-Capillary: 312 mg/dL — ABNORMAL HIGH (ref 70–99)

## 2019-01-31 NOTE — Plan of Care (Signed)
  Problem: Education: Goal: Knowledge of General Education information will improve Description Including pain rating scale, medication(s)/side effects and non-pharmacologic comfort measures Outcome: Progressing   Problem: Health Behavior/Discharge Planning: Goal: Ability to manage health-related needs will improve Outcome: Progressing   

## 2019-01-31 NOTE — Progress Notes (Signed)
NEUROSURGERY PROGRESS NOTE  No acute events overnight. Follows very simple commands intermittently, squeezes hands bilaterally. Drowsy upon examination but easily aroused.   Temp:  [98.2 F (36.8 C)-99.2 F (37.3 C)] 98.7 F (37.1 C) (05/02 0800) Pulse Rate:  [97-114] 97 (05/02 0800) Resp:  [20-34] 24 (05/02 0800) BP: (142-183)/(76-96) 159/96 (05/02 0800) SpO2:  [100 %] 100 % (05/02 0800) Weight:  [73.4 kg] 73.4 kg (05/02 0500)  Plan: Continue current plan. No new nsgy recom. Awaiting SNF placement  Sherryl Manges, NP 01/31/2019 8:52 AM
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
## 2019-02-01 LAB — GLUCOSE, CAPILLARY
Glucose-Capillary: 153 mg/dL — ABNORMAL HIGH (ref 70–99)
Glucose-Capillary: 209 mg/dL — ABNORMAL HIGH (ref 70–99)
Glucose-Capillary: 214 mg/dL — ABNORMAL HIGH (ref 70–99)
Glucose-Capillary: 218 mg/dL — ABNORMAL HIGH (ref 70–99)
Glucose-Capillary: 226 mg/dL — ABNORMAL HIGH (ref 70–99)
Glucose-Capillary: 234 mg/dL — ABNORMAL HIGH (ref 70–99)
Glucose-Capillary: 316 mg/dL — ABNORMAL HIGH (ref 70–99)

## 2019-02-01 MED ORDER — ACETAMINOPHEN 10 MG/ML IV SOLN
1000.0000 mg | Freq: Four times a day (QID) | INTRAVENOUS | Status: AC
  Administered 2019-02-01 (×3): 1000 mg via INTRAVENOUS
  Filled 2019-02-01 (×6): qty 100

## 2019-02-01 MED ORDER — METOPROLOL TARTRATE 5 MG/5ML IV SOLN
10.0000 mg | Freq: Three times a day (TID) | INTRAVENOUS | Status: DC
  Administered 2019-02-01 – 2019-02-02 (×3): 10 mg via INTRAVENOUS
  Filled 2019-02-01 (×3): qty 10
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
## 2019-02-01 NOTE — Progress Notes (Signed)
NEUROSURGERY PROGRESS NOTE  No acute events overnight. Follows simple commands occasionally. Drowsy but easily aroused.   Temp:  [98.7 F (37.1 C)-99.3 F (37.4 C)] 99.3 F (37.4 C) (05/03 0330) Pulse Rate:  [96-112] 105 (05/03 0330) Resp:  [21-25] 21 (05/03 0330) BP: (141-171)/(74-98) 171/88 (05/03 0455) SpO2:  [100 %] 100 % (05/03 0330)  Plan: Waiting for SNF placement.   Sherryl Manges, NP 02/01/2019 7:53 AM
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
## 2019-02-02 LAB — GLUCOSE, CAPILLARY
Glucose-Capillary: 162 mg/dL — ABNORMAL HIGH (ref 70–99)
Glucose-Capillary: 208 mg/dL — ABNORMAL HIGH (ref 70–99)
Glucose-Capillary: 180 mg/dL — ABNORMAL HIGH (ref 70–99)
Glucose-Capillary: 221 mg/dL — ABNORMAL HIGH (ref 70–99)
Glucose-Capillary: 207 mg/dL — ABNORMAL HIGH (ref 70–99)
Glucose-Capillary: 177 mg/dL — ABNORMAL HIGH (ref 70–99)

## 2019-02-02 LAB — BASIC METABOLIC PANEL
Anion gap: 9 (ref 5–15)
BUN: 14 mg/dL (ref 8–23)
CO2: 26 mmol/L (ref 22–32)
Calcium: 9.7 mg/dL (ref 8.9–10.3)
Chloride: 117 mmol/L — ABNORMAL HIGH (ref 98–111)
Creatinine, Ser: 1.14 mg/dL (ref 0.61–1.24)
GFR calc Af Amer: >60 mL/min (ref >60)
GFR calc non Af Amer: >60 mL/min (ref >60)
Glucose, Bld: 228 mg/dL — ABNORMAL HIGH (ref 70–99)
Potassium: 3.5 mmol/L (ref 3.5–5.1)
Sodium: 152 mmol/L — ABNORMAL HIGH (ref 135–145)

## 2019-02-02 MED ORDER — POTASSIUM CHLORIDE IN NACL 20-0.45 MEQ/L-% IV SOLN
INTRAVENOUS | Status: DC
  Administered 2019-02-02 – 2019-02-03 (×2): via INTRAVENOUS
  Filled 2019-02-02 (×2): qty 1000

## 2019-02-02 MED ORDER — METOPROLOL TARTRATE 5 MG/5ML IV SOLN
10.0000 mg | Freq: Four times a day (QID) | INTRAVENOUS | Status: DC
  Administered 2019-02-02 – 2019-02-04 (×8): 10 mg via INTRAVENOUS
  Filled 2019-02-02 (×8): qty 10

## 2019-02-02 MED ORDER — ACETAMINOPHEN 10 MG/ML IV SOLN: 1000.0000 mg | Freq: Four times a day (QID) | INTRAVENOUS | Status: DC

## 2019-02-02 MED ORDER — INSULIN ASPART 100 UNIT/ML ~~LOC~~ SOLN
4.0000 [IU] | Freq: Three times a day (TID) | SUBCUTANEOUS | Status: DC
  Administered 2019-02-03 – 2019-02-10 (×23): 4 [IU] via SUBCUTANEOUS

## 2019-02-02 MED ORDER — FREE WATER: 200.0000 mL | Freq: Three times a day (TID) | Status: DC

## 2019-02-02 MED ORDER — BISACODYL 10 MG RE SUPP
10.0000 mg | Freq: Once | RECTAL | Status: DC
  Filled 2019-02-02: qty 1

## 2019-02-02 MED ORDER — INSULIN GLARGINE 100 UNIT/ML ~~LOC~~ SOLN
7.0000 [IU] | Freq: Every day | SUBCUTANEOUS | Status: DC
  Administered 2019-02-02 – 2019-02-10 (×9): 7 [IU] via SUBCUTANEOUS
  Filled 2019-02-02 (×9): qty 0.07

## 2019-02-02 NOTE — Progress Notes (Addendum)
Inpatient Diabetes Program Recommendations  AACE/ADA: New Consensus Statement on Inpatient Glycemic Control   Target Ranges:  Prepandial:   less than 140 mg/dL      Peak postprandial:   less than 180 mg/dL (1-2 hours)      Critically ill patients:  140 - 180 mg/dL   Results for TRELYN, CIABURRI (MRN [id number]) as of 02/02/2019 09:02  Ref. Range 02/01/2019 06:56 02/01/2019 12:12 02/01/2019 16:08 02/01/2019 20:44 02/01/2019 23:42 02/02/2019 04:06 02/02/2019 07:42  Glucose-Capillary Latest Ref Range: 70 - 99 mg/dL 364 (H) 680 (H) 321 (H) 214 (H) 218 (H) 208 (H) 180 (H)   Review of Glycemic Control  Current orders for Inpatient glycemic control: Novolog 0-15 units Q4H  Inpatient Diabetes Program Recommendations:   Insulin - Basal: Please consider ordering Lantus 7 units Q24H.  Insulin -Tube Feeding Coverage: Please consider ordering Novolog 4 units TID with tube feeding bolus (to be scheduled with same timing as Osmolite 474 ml TID - 10:00, 16:00, 22:00).  Thanks, Orlando Penner, RN, MSN, CDE Diabetes Coordinator Inpatient Diabetes Program [phone] (Team Pager from 8am to 5pm)
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
## 2019-02-03 LAB — GLUCOSE, CAPILLARY
Glucose-Capillary: 155 mg/dL — ABNORMAL HIGH (ref 70–99)
Glucose-Capillary: 164 mg/dL — ABNORMAL HIGH (ref 70–99)
Glucose-Capillary: 184 mg/dL — ABNORMAL HIGH (ref 70–99)
Glucose-Capillary: 209 mg/dL — ABNORMAL HIGH (ref 70–99)
Glucose-Capillary: 231 mg/dL — ABNORMAL HIGH (ref 70–99)
Glucose-Capillary: 293 mg/dL — ABNORMAL HIGH (ref 70–99)
Glucose-Capillary: 89 mg/dL (ref 70–99)

## 2019-02-03 LAB — BASIC METABOLIC PANEL
Anion gap: 12 (ref 5–15)
BUN: 15 mg/dL (ref 8–23)
CO2: 24 mmol/L (ref 22–32)
Calcium: 9.8 mg/dL (ref 8.9–10.3)
Chloride: 114 mmol/L — ABNORMAL HIGH (ref 98–111)
Creatinine, Ser: 1 mg/dL (ref 0.61–1.24)
GFR calc Af Amer: >60 mL/min (ref >60)
GFR calc non Af Amer: >60 mL/min (ref >60)
Glucose, Bld: 183 mg/dL — ABNORMAL HIGH (ref 70–99)
Potassium: 3.6 mmol/L (ref 3.5–5.1)
Sodium: 150 mmol/L — ABNORMAL HIGH (ref 135–145)

## 2019-02-03 MED ORDER — LORAZEPAM 2 MG/ML IJ SOLN
1.0000 mg | INTRAMUSCULAR | Status: DC | PRN
  Administered 2019-02-03 – 2019-02-05 (×5): 1 mg via INTRAVENOUS
  Filled 2019-02-03 (×5): qty 1

## 2019-02-03 MED ORDER — DOCUSATE SODIUM 100 MG PO CAPS
100.0000 mg | ORAL_CAPSULE | Freq: Two times a day (BID) | ORAL | Status: DC
  Administered 2019-02-03 – 2019-02-10 (×11): 100 mg via ORAL
  Filled 2019-02-03 (×13): qty 1

## 2019-02-03 MED ORDER — ENSURE ENLIVE PO LIQD
237.0000 mL | Freq: Two times a day (BID) | ORAL | Status: DC
  Administered 2019-02-03 – 2019-02-10 (×12): 237 mL via ORAL
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
## 2019-02-03 NOTE — Care Management Important Message (Signed)
Important Message  Patient Details  Name: Brandon Sullivan MRN: [id number] Date of Birth: 1951/01/13   Medicare Important Message Given:  Yes    Geoff Dacanay 02/03/2019, 1:56 PM

## 2019-02-04 LAB — GLUCOSE, CAPILLARY
Glucose-Capillary: 110 mg/dL — ABNORMAL HIGH (ref 70–99)
Glucose-Capillary: 140 mg/dL — ABNORMAL HIGH (ref 70–99)
Glucose-Capillary: 184 mg/dL — ABNORMAL HIGH (ref 70–99)
Glucose-Capillary: 217 mg/dL — ABNORMAL HIGH (ref 70–99)
Glucose-Capillary: 225 mg/dL — ABNORMAL HIGH (ref 70–99)
Glucose-Capillary: 230 mg/dL — ABNORMAL HIGH (ref 70–99)

## 2019-02-04 LAB — BASIC METABOLIC PANEL
Anion gap: 13 (ref 5–15)
BUN: 16 mg/dL (ref 8–23)
CO2: 24 mmol/L (ref 22–32)
Calcium: 9.4 mg/dL (ref 8.9–10.3)
Chloride: 111 mmol/L (ref 98–111)
Creatinine, Ser: 1.06 mg/dL (ref 0.61–1.24)
GFR calc Af Amer: >60 mL/min (ref >60)
GFR calc non Af Amer: >60 mL/min (ref >60)
Glucose, Bld: 154 mg/dL — ABNORMAL HIGH (ref 70–99)
Potassium: 3.8 mmol/L (ref 3.5–5.1)
Sodium: 148 mmol/L — ABNORMAL HIGH (ref 135–145)

## 2019-02-04 MED ORDER — CLONAZEPAM 0.5 MG PO TABS
0.5000 mg | ORAL_TABLET | Freq: Two times a day (BID) | ORAL | Status: DC
  Administered 2019-02-04: 0.5 mg via ORAL
  Filled 2019-02-04 (×2): qty 1

## 2019-02-04 MED ORDER — METOPROLOL TARTRATE 25 MG PO TABS
25.0000 mg | ORAL_TABLET | Freq: Two times a day (BID) | ORAL | Status: DC
  Administered 2019-02-04 – 2019-02-10 (×11): 25 mg via ORAL
  Filled 2019-02-04 (×13): qty 1
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
## 2019-02-04 NOTE — Progress Notes (Signed)
Client asleep and not alert enough to given PO medications due to ativan being given at 1850 by day shift. Medications held at this time. Olevia Perches

## 2019-02-04 NOTE — Plan of Care (Signed)
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
## 2019-02-04 NOTE — Progress Notes (Signed)
Restraints not in use upon assuming care. Patient sleeping quietly in bed since shift change. Will continue to monitor.  Olevia Perches

## 2019-02-05 LAB — GLUCOSE, CAPILLARY
Glucose-Capillary: 136 mg/dL — ABNORMAL HIGH (ref 70–99)
Glucose-Capillary: 217 mg/dL — ABNORMAL HIGH (ref 70–99)
Glucose-Capillary: 266 mg/dL — ABNORMAL HIGH (ref 70–99)
Glucose-Capillary: 156 mg/dL — ABNORMAL HIGH (ref 70–99)
Glucose-Capillary: 77 mg/dL (ref 70–99)
Glucose-Capillary: 189 mg/dL — ABNORMAL HIGH (ref 70–99)

## 2019-02-05 MED ORDER — QUETIAPINE FUMARATE 100 MG PO TABS
100.0000 mg | ORAL_TABLET | Freq: Every day | ORAL | Status: DC
  Administered 2019-02-05 – 2019-02-08 (×4): 100 mg via ORAL
  Filled 2019-02-05 (×5): qty 1

## 2019-02-05 MED ORDER — CLONAZEPAM 1 MG PO TABS
1.0000 mg | ORAL_TABLET | Freq: Two times a day (BID) | ORAL | Status: DC
  Administered 2019-02-05 – 2019-02-10 (×10): 1 mg via ORAL
  Filled 2019-02-05 (×8): qty 1
  Filled 2019-02-05: qty 2
  Filled 2019-02-05 (×2): qty 1

## 2019-02-05 NOTE — Plan of Care (Signed)
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
## 2019-02-06 LAB — GLUCOSE, CAPILLARY
Glucose-Capillary: 149 mg/dL — ABNORMAL HIGH (ref 70–99)
Glucose-Capillary: 190 mg/dL — ABNORMAL HIGH (ref 70–99)
Glucose-Capillary: 227 mg/dL — ABNORMAL HIGH (ref 70–99)
Glucose-Capillary: 281 mg/dL — ABNORMAL HIGH (ref 70–99)
Glucose-Capillary: 296 mg/dL — ABNORMAL HIGH (ref 70–99)

## 2019-02-06 MED ORDER — INSULIN ASPART 100 UNIT/ML ~~LOC~~ SOLN
0.0000 [IU] | Freq: Three times a day (TID) | SUBCUTANEOUS | Status: DC
  Administered 2019-02-06 (×2): 8 [IU] via SUBCUTANEOUS
  Administered 2019-02-07: 5 [IU] via SUBCUTANEOUS
  Administered 2019-02-07: 3 [IU] via SUBCUTANEOUS
  Administered 2019-02-07: 8 [IU] via SUBCUTANEOUS
  Administered 2019-02-07 – 2019-02-08 (×2): 5 [IU] via SUBCUTANEOUS
  Administered 2019-02-08 (×2): 3 [IU] via SUBCUTANEOUS
  Administered 2019-02-08: 23:00:00 11 [IU] via SUBCUTANEOUS
  Administered 2019-02-09: 3 [IU] via SUBCUTANEOUS
  Administered 2019-02-09: 2 [IU] via SUBCUTANEOUS
  Administered 2019-02-09: 3 [IU] via SUBCUTANEOUS
  Administered 2019-02-09: 8 [IU] via SUBCUTANEOUS
  Administered 2019-02-10: 2 [IU] via SUBCUTANEOUS
  Administered 2019-02-10: 5 [IU] via SUBCUTANEOUS
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
## 2019-02-06 NOTE — Plan of Care (Signed)
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
## 2019-02-06 NOTE — Progress Notes (Signed)
PT Cancellation Note  Patient Details Name: Brandon Sullivan MRN: [id number] DOB: 1951-07-24   Cancelled Treatment:    Reason Eval/Treat Not Completed: Other (comment) Attempted PT session today but upon waking up, pt became increasingly more irritated progressing to agitation  elevating voice stating, "you are getting me real upset right now," over and over. Not able to be redirected or distracted. Will follow.   Blake Divine A [name] 02/06/2019, 12:49 PM Mylo Red, PT, DPT Acute Rehabilitation Services Pager [phone] Office [phone]

## 2019-02-06 NOTE — Progress Notes (Signed)
CSW spoke with patient's sister Babette Relic ([phone]) to inform her that the patient received a bed offer from [facility]. Patient's sister was agreeable to accepting that bed offer and to him being discharged to that facility. Sister also agreeable to having her contact information given to facility to be contacted.  Edwin Dada, MSW, LCSW-A Clinical Social Worker Emergency Department [employer] [phone]

## 2019-02-06 NOTE — Discharge Instructions (Signed)
Living With Traumatic Brain Injury  Traumatic brain injury (TBI) is an injury to the brain that may be mild, moderate, or severe. Symptoms of any type of TBI can be long lasting (chronic). Depending on the area of the brain that is affected, a TBI can interfere with vision, memory, concentration, speech, balance, sense of touch, and sleep. TBI can also cause chronic symptoms like headache or dizziness.  How to cope with lifestyle changes    After a TBI, you may need to make changes to your lifestyle in order to recover as well as possible. How quickly and how fully you recover will depend on the severity of your injury. Your recovery plan may involve:  · Working with specialists to develop a rehabilitation plan to help you return to your regular activities. Your health care team may include:  ? Physical or occupational therapists.  ? Speech and language pathologists.  ? Mental health counselors.  ? Physicians like your primary care physician or neurologist.  · Taking time off work or school, depending on your injury.  · Avoiding situations where there is a risk for another head injury, such as football, hockey, soccer, basketball, martial arts, downhill snow sports, and horseback riding. Do not do these activities until your health care provider approves.  · Resting. Rest helps the brain to heal. Make sure you:  ? Get plenty of sleep at night. Avoid staying up late at night.  ? Keep the same bedtime hours on weekends and weekdays.  ? Rest during the day. Take daytime naps or rest breaks when you feel tired.  · Avoiding extra stress on your eyes. You may need to set time limits when working on the computer, watching TV, and reading.  · Finding ways to manage stress. This may include:  ? Avoiding activities that cause stress.  ? Deep breathing, yoga, or meditation.  ? Listening to music or spending time outdoors.  · Making lists, setting reminders, or using a day planner to help your memory.  · Allowing yourself  plenty of time to complete everyday tasks, such as grocery shopping, paying bills, and doing laundry.  · Avoiding driving. Your ability to drive safely may be affected by your injury.  ? Rely on family, friends, or a transportation service to help you get around and to appointments.  ? Have a professional evaluation to check your driving ability.  ? Access support services to help you return to driving. These may include training and adaptive equipment.  Follow these instructions at home:  · Take over-the-counter and prescription medicines only as told by your health care provider. Do not take aspirin or other anti-inflammatory medicines such as ibuprofen or naproxen unless approved by your health care provider.  · Avoid large amounts of caffeine. Your body may be more sensitive to it after your injury.  · Do not use any products that contain nicotine or tobacco, such as cigarettes, e-cigarettes, nicotine gum, and patches. If you need help quitting, ask your health care provider.  · Do not use drugs.  · Limit alcohol intake to no more than 1 drink per day for nonpregnant women and 2 drinks per day for men. One drink equals 12 ounces of beer, 5 ounces of wine, or 1½ ounces of hard liquor.  · Do not drive until cleared by your health care provider.  · Keep all follow-up visits as told by your health care provider. This is important.  Where to find support  · Talk with   your employer, co-workers, teachers, or school counselor about your injury. Work together to develop a plan for completing tasks while you recover.  · Talk to others living with a TBI. Join a support group with other people who have experienced a TBI.  · Let your friends and family members know what they can do to help. This might include helping at home or transportation to appointments.  · If you are unable to continue working after your injury, talk to a social worker about options to help you meet your financial needs.  · Seek out additional  resources if you are a military serviceman or family member, such as:  ? Defense and Veterans Brain Injury Center: dvbic.dcoe.mil  ? Department of Veterans Affairs Military and Veterans Crisis Line: 1-800-273-8255  Questions to ask your health care provider:  · How serious is my injury?  · What is my rehabilitation plan?  · What is my expected recovery?  · When can I return to work or school?  · When can I return to regular activities, including driving?  Contact a health care provider if:  · You have new or worsening:  ? Dizziness.  ? Headache.  ? Anxiety or depression.  ? Irritability.  ? Confusion.  ? Jerky movements that you cannot control (seizures).  ? Extreme sensitivity to light or sound.  ? Nausea or vomiting.  Summary  · Traumatic brain injury (TBI) is an injury to your brain that can interfere with vision, memory, concentration, speech, balance, sense of touch, and sleep. TBI can also cause chronic symptoms like headache or dizziness.  · After a TBI you may need to make several changes to your lifestyle in order to recover as well as possible. How quickly and how fully you recover will depend on the severity of your injury.  · Talk to your family, friends, employer, co-workers, teachers, or school counselor about your injury. Work together to develop a plan for completing tasks while you recover.  This information is not intended to replace advice given to you by your health care provider. Make sure you discuss any questions you have with your health care provider.  Document Released: 09/13/2016 Document Revised: 09/13/2016 Document Reviewed: 09/13/2016  Elsevier Interactive Patient Education © 2019 Elsevier Inc.         Supporting Someone With Traumatic Brain Injury  Traumatic brain injury (TBI) is an injury to the brain that results from a hard, direct hit to the head. It also results from whiplash or a direct blow to the head by an object. TBI can be mild, moderate, or severe.  What do I need to know  about this condition?  Symptoms of any type of TBI can be long lasting (chronic). Depending on the area of the brain that is affected, a TBI can interfere with vision, memory, concentration, speech, balance, sense of touch, and sleep. TBI can also cause chronic symptoms like headache or dizziness.  A mild TBI is also known as a concussion. A concussion usually involves being knocked-out (losing consciousness) for a short time or not at all, and results in less brain damage. A moderate or severe TBI involves losing consciousness for an extended period of time. This can result in more serious damage to the brain, such as memory loss after the injury.  What do I need to know about my loved one's recovery?  Traumatic brain injuries affect different people in different ways. It is important to understand that recovery is different for each person,   and may take time.  Mild injury  Many people with a mild brain injury recover quickly and return to their normal activities and abilities. However, some people may continue to experience problems associated with their brain injury. This can cause frustration, anger, and moodiness for the person and their loved ones, especially if they expected a normal and complete recovery.  Moderate or severe injury  After a moderate or severe injury, there is usually an initial recovery period that may last up to several weeks. During this time, your loved one may stay in the hospital or a rehabilitation center. Depending on your loved one's injury, age, recovery, and overall health, he or she may:  · Be allowed to return home or enter a long-term care facility.  · Need to continue rehabilitation with occupational, physical, and speech therapists to help restore their mobility and their ability to process information, speak, and do daily activities.  · Have changes to mood and personality.  · Experience changes to sleep, energy levels, appetite, balance, bladder control, and vision.  · Have  long-term health complications, such as seizures, headaches, or chronic pain.  · Need to take an extended time off work or school. Some people are not able to return to these activities after their injury.  How can I support my friend or family member?  General help  · Offer assistance with household chores or other daily tasks. Cook or make "freezer meals" that can be reheated.  · Help your loved one establish a daily routine. This may include setting reminders or having a shared day planner or calendar.  · Encourage rest. Your loved one may need frequent breaks during social situations or other activities.  · Be patient. Your loved one may take longer to complete tasks and process information.  · When giving instructions, give only one instruction at a time or make lists. Multitasking can be difficult for a person with a TBI.  · Do not set expectations about your loved one's recovery.  Medical appointments  · Gently remind your loved one of tasks or appointments if he or she is forgetful.  · Provide transportation to and from appointments.  · Attend rehabilitation appointments with your loved one. Help with exercises at home.  Preventing falls  Take steps to prevent falls in your loved one's home. This may include:  · Installing grab bars in the bathroom or handrails on stairs.  · Using night lights in the bedroom, bathroom, or hallways.  · Securing or removing rugs and cords in walkways.  Managing finances  · Help manage finances. Talk with a social worker or legal professional if:  ? Your loved one is unable to return to work and is in need of financial help.  ? You need help paying medical bills.  ? You need to establish guardianship over finances.  ? You need help with estate planning.  How can I care for myself?  Lifestyle       · Do not use drugs.  · Avoid or limit alcohol use. Limit alcohol intake to no more than 1 drink per day for nonpregnant women and 2 drinks per day for men. One drink equals 12 ounces  of beer, 5 ounces of wine, or 1½ ounces of hard liquor.  · Rest. Try to get 7-9 hours of uninterrupted sleep each night.  · Eat a balanced diet that includes fresh fruits and vegetables, whole grains, lean proteins, and low-fat dairy.  · Exercise for at least 30   minutes on 5 or more days each week.  · Find ways to manage stress. This may include:  ? Deep breathing, yoga, or meditation.  ? Spending time outdoors.  ? Journaling.  Finding support  · Ask for help. Take a break if you are the primary caregiver to your loved one.  · Spend time with supportive people.  · Join a support group with other caregivers or family members of people with TBI.  · If you experience new or worsening depression or anxiety, seek counseling from a mental health professional.  Where to find more information  · Learn more about TBI and find support through:  ? Brainline: www.brainline.org  ? Brain Injury Association of America: www.biausa.org  · For military servicemen and their families:  ? Defense and Veterans Brain Injury Center: dvbic.dcoe.mil  ? Department of Veterans Affairs Military and Veterans Crisis Line: 1-800-273-8255  Summary  · A Traumatic brain injury (TBI) is an injury to the brain that can interfere with vision, memory, concentration, speech, balance, sense of touch, and sleep. TBI can also cause chronic symptoms like headache or dizziness.  · Traumatic brain injuries affect different people in different ways. It is important to understand that recovery takes time and looks different for each person.  · You can support your loved one with a TBI by assisting with daily tasks, setting reminders, encouraging rest, and being patient.  · It is important to take care of yourself as a caregiver. Take time to exercise, manage stress, spend time with supportive people, and rest. Ask for help, find a support group, and meet with a mental health counselor, if needed.  This information is not intended to replace advice given to you by  your health care provider. Make sure you discuss any questions you have with your health care provider.  Document Released: 09/14/2016 Document Revised: 09/14/2016 Document Reviewed: 09/14/2016  Elsevier Interactive Patient Education © 2019 Elsevier Inc.   

## 2019-02-07 LAB — GLUCOSE, CAPILLARY
Glucose-Capillary: 169 mg/dL — ABNORMAL HIGH (ref 70–99)
Glucose-Capillary: 191 mg/dL — ABNORMAL HIGH (ref 70–99)
Glucose-Capillary: 215 mg/dL — ABNORMAL HIGH (ref 70–99)
Glucose-Capillary: 223 mg/dL — ABNORMAL HIGH (ref 70–99)
Glucose-Capillary: 228 mg/dL — ABNORMAL HIGH (ref 70–99)
Glucose-Capillary: 268 mg/dL — ABNORMAL HIGH (ref 70–99)
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
## 2019-02-08 LAB — GLUCOSE, CAPILLARY
Glucose-Capillary: 162 mg/dL — ABNORMAL HIGH (ref 70–99)
Glucose-Capillary: 250 mg/dL — ABNORMAL HIGH (ref 70–99)
Glucose-Capillary: 330 mg/dL — ABNORMAL HIGH (ref 70–99)

## 2019-02-08 MED ORDER — LORAZEPAM 2 MG/ML IJ SOLN: 1.0000 mg | INTRAMUSCULAR | Status: DC | PRN
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
## 2019-02-09 LAB — GLUCOSE, CAPILLARY
Glucose-Capillary: 197 mg/dL — ABNORMAL HIGH (ref 70–99)
Glucose-Capillary: 195 mg/dL — ABNORMAL HIGH (ref 70–99)
Glucose-Capillary: 251 mg/dL — ABNORMAL HIGH (ref 70–99)
Glucose-Capillary: 126 mg/dL — ABNORMAL HIGH (ref 70–99)
Glucose-Capillary: 219 mg/dL — ABNORMAL HIGH (ref 70–99)

## 2019-02-09 NOTE — Care Management Important Message (Signed)
Important Message  Patient Details  Name: Brandon Sullivan MRN: [id number] Date of Birth: 01-10-1951   Medicare Important Message Given:  Yes    Henrik Orihuela Stefan Church 02/09/2019, 4:01 PM
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
## 2019-02-09 NOTE — TOC Progression Note (Signed)
Transition of Care [facility]) - Progression Note    Patient Details  Name: Brandon Sullivan MRN: [id number] Date of Birth: Jan 11, 1951  Transition of Care [facility]) CM/SW Contact  Gates Rigg Joyice Faster, [place] Phone Number: [phone] 02/09/2019, 4:02 PM  Clinical Narrative:     Clinical Social Worker continuing to follow patient and family for support and discharge planning needs.  Patient was arranged for placement, when it was determined that he actually has coverage through [facility] - facility offer was not in network.  CSW has made arrangements with Joetta Manners for patient to discharge tomorrow.  CSW spoke with Lupita Leash at [facility] who states that an authorization will be available 5/12 for admission to facility.  Patient sister updated with plans and agreeable with discharge tomorrow.  CSW to update trauma team and will facilitate patient discharge needs.  Expected Discharge Plan: Skilled Nursing Facility Barriers to Discharge: Continued Medical Work up  Expected Discharge Plan and Services Expected Discharge Plan: Skilled Nursing Facility In-house Referral: Clinical Social Work Discharge Planning Services: NA Post Acute Care Choice: Skilled Nursing Facility Living arrangements for the past 2 months: Single Family Home                 DME Arranged: N/A DME Agency: NA       HH Arranged: NA HH Agency: NA         Social Determinants of Health (SDOH) Interventions    Readmission Risk Interventions No flowsheet data found.

## 2019-02-09 NOTE — Progress Notes (Signed)
Inpatient Diabetes Program Recommendations  AACE/ADA: New Consensus Statement on Inpatient Glycemic Control (2015)  Target Ranges:  Prepandial:   less than 140 mg/dL      Peak postprandial:   less than 180 mg/dL (1-2 hours)      Critically ill patients:  140 - 180 mg/dL   Lab Results  Component Value Date   GLUCAP 195 (H) 02/09/2019    Review of Glycemic Control Results for Brandon Sullivan, Brandon Sullivan (MRN [id number]) as of 02/09/2019 12:27  Ref. Range 02/08/2019 16:28 02/08/2019 22:51 02/09/2019 08:13  Glucose-Capillary Latest Ref Range: 70 - 99 mg/dL 626 (H) 948 (H) 546 (H)   Diabetes history: Type 2 DM? Outpatient Diabetes medications: none Current orders for Inpatient glycemic control: Novolog 0-15 units TID, Novolog 4 units TID, Lantus 7 units QD  Inpatient Diabetes Program Recommendations:   -No documented A1C.  -Would recommend increasing Lantus to 10 units QD.   Thanks, Lujean Rave, MSN, RNC-OB Diabetes Coordinator [phone] (8a-5p)

## 2019-02-09 NOTE — Plan of Care (Signed)

## 2019-02-10 LAB — GLUCOSE, CAPILLARY
Glucose-Capillary: 213 mg/dL — ABNORMAL HIGH (ref 70–99)
Glucose-Capillary: 138 mg/dL — ABNORMAL HIGH (ref 70–99)

## 2019-02-10 MED ORDER — QUETIAPINE FUMARATE 100 MG PO TABS: 100.0000 mg | ORAL_TABLET | Freq: Every day | ORAL | 0 refills | Status: DC

## 2019-02-10 MED ORDER — CLONAZEPAM 1 MG PO TABS: 1.0000 mg | ORAL_TABLET | Freq: Two times a day (BID) | ORAL | 0 refills | Status: DC

## 2019-02-10 MED ORDER — QUETIAPINE FUMARATE 100 MG PO TABS: 100.0000 mg | ORAL_TABLET | Freq: Every day | ORAL | Status: DC

## 2019-02-10 MED ORDER — METOPROLOL TARTRATE 25 MG PO TABS: 25.0000 mg | ORAL_TABLET | Freq: Two times a day (BID) | ORAL | Status: DC

## 2019-02-10 MED ORDER — DOCUSATE SODIUM 100 MG PO CAPS: 100.0000 mg | ORAL_CAPSULE | Freq: Two times a day (BID) | ORAL | 0 refills | Status: DC

## 2019-02-10 MED ORDER — ACETAMINOPHEN 325 MG PO TABS: 650.0000 mg | ORAL_TABLET | Freq: Four times a day (QID) | ORAL | Status: DC | PRN
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
## 2019-02-10 NOTE — TOC Transition Note (Signed)
Transition of Care [facility]) - CM/SW Discharge Note   Patient Details  Name: Montero Monce MRN: [id number] Date of Birth: 30-Oct-1950  [facility]) CM/SW Contact:  Legrand Como, LCSW Phone Number: [phone] 02/10/2019, 12:41 PM   Clinical Narrative:     Clinical Social Worker facilitated patient discharge including contacting patient family and facility to confirm patient discharge plans.  Clinical information faxed to facility and family agreeable with plan.  Insurance authorization received [facility] # [id number])  CSW arranged ambulance transport via [facility] to [facility].  RN to call report prior to discharge [phone] Room 3247).  Clinical Social Worker will sign off for now as social work intervention is no longer needed. Please consult [place] again if new need arises.  Final next level of care: Skilled Nursing Facility Barriers to Discharge: No Barriers Identified   Patient Goals and CMS Choice Patient states their goals for this hospitalization and ongoing recovery are:: Pt sister would like her brother to go to rehab CMS Medicare.gov Compare Post Acute Care list provided to:: Patient Represenative (must comment) Choice offered to / list presented to : Sibling  Discharge Placement PASRR number recieved: 01/30/19            Patient [facility] bed at: [facility] Patient to be transferred to facility by: Ambulance Name of family member notified: Patient sister [name] over the phone Patient and family notified of of transfer: 02/10/19  Discharge Plan and Services In-house Referral: Clinical Social Work Discharge Planning Services: NA Post Acute Care Choice: Skilled Nursing Facility          DME Arranged: N/A DME Agency: NA       HH Arranged: NA HH Agency: NA        Social Determinants of Health (SDOH) Interventions     Readmission Risk Interventions No flowsheet data found.

## 2019-02-11 ENCOUNTER — Encounter (HOSPITAL_COMMUNITY): Payer: Self-pay | Admitting: Emergency Medicine

## 2019-04-09 ENCOUNTER — Encounter (INDEPENDENT_AMBULATORY_CARE_PROVIDER_SITE_OTHER): Payer: Self-pay | Admitting: Primary Care

## 2019-04-09 ENCOUNTER — Other Ambulatory Visit: Payer: Self-pay

## 2019-04-09 ENCOUNTER — Ambulatory Visit (INDEPENDENT_AMBULATORY_CARE_PROVIDER_SITE_OTHER): Payer: Managed Care, Other (non HMO) | Admitting: Primary Care

## 2019-04-09 VITALS — BP 153/88 | HR 60 | Temp 98.2°F | Ht 71.0 in | Wt 156.2 lb

## 2019-04-09 DIAGNOSIS — Z131 Encounter for screening for diabetes mellitus: Secondary | ICD-10-CM | POA: Diagnosis not present

## 2019-04-09 DIAGNOSIS — R5381 Other malaise: Secondary | ICD-10-CM

## 2019-04-09 DIAGNOSIS — I10 Essential (primary) hypertension: Secondary | ICD-10-CM | POA: Diagnosis not present

## 2019-04-09 DIAGNOSIS — H539 Unspecified visual disturbance: Secondary | ICD-10-CM

## 2019-04-09 DIAGNOSIS — E119 Type 2 diabetes mellitus without complications: Secondary | ICD-10-CM

## 2019-04-09 DIAGNOSIS — R739 Hyperglycemia, unspecified: Secondary | ICD-10-CM | POA: Diagnosis not present

## 2019-04-09 DIAGNOSIS — E782 Mixed hyperlipidemia: Secondary | ICD-10-CM

## 2019-04-09 DIAGNOSIS — Z23 Encounter for immunization: Secondary | ICD-10-CM

## 2019-04-09 DIAGNOSIS — Z7689 Persons encountering health services in other specified circumstances: Secondary | ICD-10-CM | POA: Diagnosis not present

## 2019-04-09 DIAGNOSIS — S069X0D Unspecified intracranial injury without loss of consciousness, subsequent encounter: Secondary | ICD-10-CM

## 2019-04-09 DIAGNOSIS — Z1211 Encounter for screening for malignant neoplasm of colon: Secondary | ICD-10-CM

## 2019-04-09 DIAGNOSIS — F1010 Alcohol abuse, uncomplicated: Secondary | ICD-10-CM

## 2019-04-09 LAB — POCT GLYCOSYLATED HEMOGLOBIN (HGB A1C): Hemoglobin A1C: 8.6 % — AB (ref 4.0–5.6)

## 2019-04-09 LAB — POCT CBG (FASTING - GLUCOSE)-MANUAL ENTRY: Glucose Fasting, POC: 235 mg/dL — AB (ref 70–99)

## 2019-04-09 MED ORDER — METFORMIN HCL 1000 MG PO TABS: 1000.0000 mg | ORAL_TABLET | Freq: Two times a day (BID) | ORAL | 3 refills | Status: DC

## 2019-04-09 MED ORDER — LOSARTAN POTASSIUM 50 MG PO TABS: 50.0000 mg | ORAL_TABLET | Freq: Every day | ORAL | 3 refills | Status: DC

## 2019-04-09 MED ORDER — METOPROLOL TARTRATE 25 MG PO TABS
25.0000 mg | ORAL_TABLET | Freq: Two times a day (BID) | ORAL | 3 refills | Status: DC
Start: 2019-04-09 — End: 2019-08-10

## 2019-04-09 MED ORDER — GLIPIZIDE 10 MG PO TABS: 10.0000 mg | ORAL_TABLET | Freq: Two times a day (BID) | ORAL | 3 refills | Status: DC

## 2019-04-09 NOTE — Progress Notes (Signed)
Pt complains of pain in right knee and elbow Pt states he needs eyeglasses

## 2019-04-09 NOTE — Patient Instructions (Signed)
Managing Your Hypertension °Hypertension is commonly called high blood pressure. This is when the force of your blood pressing against the walls of your arteries is too strong. Arteries are blood vessels that carry blood from your heart throughout your body. Hypertension forces the heart to work harder to pump blood, and may cause the arteries to become narrow or stiff. Having untreated or uncontrolled hypertension can cause heart attack, stroke, kidney disease, and other problems. °What are blood pressure readings? °A blood pressure reading consists of a higher number over a lower number. Ideally, your blood pressure should be below 120/80. The first ("top") number is called the systolic pressure. It is a measure of the pressure in your arteries as your heart beats. The second ("bottom") number is called the diastolic pressure. It is a measure of the pressure in your arteries as the heart relaxes. °What does my blood pressure reading mean? °Blood pressure is classified into four stages. Based on your blood pressure reading, your health care provider may use the following stages to determine what type of treatment you need, if any. Systolic pressure and diastolic pressure are measured in a unit called mm Hg. °Normal °· Systolic pressure: below 120. °· Diastolic pressure: below 80. °Elevated °· Systolic pressure: 120-129. °· Diastolic pressure: below 80. °Hypertension stage 1 °· Systolic pressure: 130-139. °· Diastolic pressure: 80-89. °Hypertension stage 2 °· Systolic pressure: 140 or above. °· Diastolic pressure: 90 or above. °What health risks are associated with hypertension? °Managing your hypertension is an important responsibility. Uncontrolled hypertension can lead to: °· A heart attack. °· A stroke. °· A weakened blood vessel (aneurysm). °· Heart failure. °· Kidney damage. °· Eye damage. °· Metabolic syndrome. °· Memory and concentration problems. °What changes can I make to manage my  hypertension? °Hypertension can be managed by making lifestyle changes and possibly by taking medicines. Your health care provider will help you make a plan to bring your blood pressure within a normal range. °Eating and drinking ° °· Eat a diet that is high in fiber and potassium, and low in salt (sodium), added sugar, and fat. An example eating plan is called the DASH (Dietary Approaches to Stop Hypertension) diet. To eat this way: °? Eat plenty of fresh fruits and vegetables. Try to fill half of your plate at each meal with fruits and vegetables. °? Eat whole grains, such as whole wheat pasta, brown rice, or whole grain bread. Fill about one quarter of your plate with whole grains. °? Eat low-fat diary products. °? Avoid fatty cuts of meat, processed or cured meats, and poultry with skin. Fill about one quarter of your plate with lean proteins such as fish, chicken without skin, beans, eggs, and tofu. °? Avoid premade and processed foods. These tend to be higher in sodium, added sugar, and fat. °· Reduce your daily sodium intake. Most people with hypertension should eat less than 1,500 mg of sodium a day. °· Limit alcohol intake to no more than 1 drink a day for nonpregnant women and 2 drinks a day for men. One drink equals 12 oz of beer, 5 oz of wine, or 1½ oz of hard liquor. °Lifestyle °· Work with your health care provider to maintain a healthy body weight, or to lose weight. Ask what an ideal weight is for you. °· Get at least 30 minutes of exercise that causes your heart to beat faster (aerobic exercise) most days of the week. Activities may include walking, swimming, or biking. °· Include exercise   at least 3 days a week. Do not use any products that contain nicotine or tobacco, such as cigarettes and e-cigarettes. If you need help quitting, ask your health care provider. Control any long-term (chronic) conditions you  have, such as high cholesterol or diabetes. Monitoring Monitor your blood pressure at home as told by your health care provider. Your personal target blood pressure may vary depending on your medical conditions, your age, and other factors. Have your blood pressure checked regularly, as often as told by your health care provider. Working with your health care provider Review all the medicines you take with your health care provider because there may be side effects or interactions. Talk with your health care provider about your diet, exercise habits, and other lifestyle factors that may be contributing to hypertension. Visit your health care provider regularly. Your health care provider can help you create and adjust your plan for managing hypertension. Will I need medicine to control my blood pressure? Your health care provider may prescribe medicine if lifestyle changes are not enough to get your blood pressure under control, and if: Your systolic blood pressure is 130 or higher. Your diastolic blood pressure is 80 or higher. Take medicines only as told by your health care provider. Follow the directions carefully. Blood pressure medicines must be taken as prescribed. The medicine does not work as well when you skip doses. Skipping doses also puts you at risk for problems. Contact a health care provider if: You think you are having a reaction to medicines you have taken. You have repeated (recurrent) headaches. You feel dizzy. You have swelling in your ankles. You have trouble with your vision. Get help right away if: You develop a severe headache or confusion. You have unusual weakness or numbness, or you feel faint. You have severe pain in your chest or abdomen. You vomit repeatedly. You have trouble breathing. Summary Hypertension is when the force of blood pumping through your arteries is too strong. If this condition is not controlled, it may put you at risk for serious  complications. Your personal target blood pressure may vary depending on your medical conditions, your age, and other factors. For most people, a normal blood pressure is less than 120/80. Hypertension is managed by lifestyle changes, medicines, or both. Lifestyle changes include weight loss, eating a healthy, low-sodium diet, exercising more, and limiting alcohol. This information is not intended to replace advice given to you by your health care provider. Make sure you discuss any questions you have with your health care provider. Document Released: 06/11/2012 Document Revised: 01/09/2019 Document Reviewed: 08/15/2016 Elsevier Patient Education  [place]. Diabetes Mellitus and Nutrition, Adult When you have diabetes (diabetes mellitus), it is very important to have healthy eating habits because your blood sugar (glucose) levels are greatly affected by what you eat and drink. Eating healthy foods in the appropriate amounts, at about the same times every day, can help you:  Control your blood glucose.  Lower your risk of heart disease.  Improve your blood pressure.  Reach or maintain a healthy weight. Every person with diabetes is different, and each person has different needs for a meal plan. Your health care provider may recommend that you work with a diet and nutrition specialist (dietitian) to make a meal plan that is best for you. Your meal plan may vary depending on factors such as:  The calories you need.  The medicines you take.  Your weight.  Your blood glucose, blood pressure, and cholesterol  work with a diet and nutrition specialist (dietitian) to make a meal plan that is best for you. Your meal plan may vary depending on factors such as: °· The calories you need. °· The medicines you take. °· Your weight. °· Your blood glucose, blood pressure, and cholesterol levels. °· Your activity level. °· Other health conditions you have, such as heart or kidney disease. °How do carbohydrates affect me? °Carbohydrates, also called carbs, affect your blood glucose level more than any other type of food. Eating carbs naturally raises the  amount of glucose in your blood. Carb counting is a method for keeping track of how many carbs you eat. Counting carbs is important to keep your blood glucose at a healthy level, especially if you use insulin or take certain oral diabetes medicines. °It is important to know how many carbs you can safely have in each meal. This is different for every person. Your dietitian can help you calculate how many carbs you should have at each meal and for each snack. °Foods that contain carbs include: °· Bread, cereal, rice, pasta, and crackers. °· Potatoes and corn. °· Peas, beans, and lentils. °· Milk and yogurt. °· Fruit and juice. °· Desserts, such as cakes, cookies, ice cream, and candy. °How does alcohol affect me? °Alcohol can cause a sudden decrease in blood glucose (hypoglycemia), especially if you use insulin or take certain oral diabetes medicines. Hypoglycemia can be a life-threatening condition. Symptoms of hypoglycemia (sleepiness, dizziness, and confusion) are similar to symptoms of having too much alcohol. °If your health care provider says that alcohol is safe for you, follow these guidelines: °· Limit alcohol intake to no more than 1 drink per day for nonpregnant women and 2 drinks per day for men. One drink equals 12 oz of beer, 5 oz of wine, or 1½ oz of hard liquor. °· Do not drink on an empty stomach. °· Keep yourself hydrated with water, diet soda, or unsweetened iced tea. °· Keep in mind that regular soda, juice, and other mixers may contain a lot of sugar and must be counted as carbs. °What are tips for following this plan? ° °Reading food labels °· Start by checking the serving size on the "Nutrition Facts" label of packaged foods and drinks. The amount of calories, carbs, fats, and other nutrients listed on the label is based on one serving of the item. Many items contain more than one serving per package. °· Check the total grams (g) of carbs in one serving. You can calculate the number of servings  of carbs in one serving by dividing the total carbs by 15. For example, if a food has 30 g of total carbs, it would be equal to 2 servings of carbs. °· Check the number of grams (g) of saturated and trans fats in one serving. Choose foods that have low or no amount of these fats. °· Check the number of milligrams (mg) of salt (sodium) in one serving. Most people should limit total sodium intake to less than 2,300 mg per day. °· Always check the nutrition information of foods labeled as "low-fat" or "nonfat". These foods may be higher in added sugar or refined carbs and should be avoided. °· Talk to your dietitian to identify your daily goals for nutrients listed on the label. °Shopping °· Avoid buying canned, premade, or processed foods. These foods tend to be high in fat, sodium, and added sugar. °· Shop around the outside edge of the grocery store. This includes   fresh fruits and vegetables, bulk grains, fresh meats, and fresh dairy. °Cooking °· Use low-heat cooking methods, such as baking, instead of high-heat cooking methods like deep frying. °· Cook using healthy oils, such as olive, canola, or sunflower oil. °· Avoid cooking with butter, cream, or high-fat meats. °Meal planning °· Eat meals and snacks regularly, preferably at the same times every day. Avoid going long periods of time without eating. °· Eat foods high in fiber, such as fresh fruits, vegetables, beans, and whole grains. Talk to your dietitian about how many servings of carbs you can eat at each meal. °· Eat 4-6 ounces (oz) of lean protein each day, such as lean meat, chicken, fish, eggs, or tofu. One oz of lean protein is equal to: °? 1 oz of meat, chicken, or fish. °? 1 egg. °? ¼ cup of tofu. °· Eat some foods each day that contain healthy fats, such as avocado, nuts, seeds, and fish. °Lifestyle °· Check your blood glucose regularly. °· Exercise regularly as told by your health care provider. This may include: °? 150 minutes of  moderate-intensity or vigorous-intensity exercise each week. This could be brisk walking, biking, or water aerobics. °? Stretching and doing strength exercises, such as yoga or weightlifting, at least 2 times a week. °· Take medicines as told by your health care provider. °· Do not use any products that contain nicotine or tobacco, such as cigarettes and e-cigarettes. If you need help quitting, ask your health care provider. °· Work with a counselor or diabetes educator to identify strategies to manage stress and any emotional and social challenges. °Questions to ask a health care provider °· Do I need to meet with a diabetes educator? °· Do I need to meet with a dietitian? °· What number can I call if I have questions? °· When are the best times to check my blood glucose? °Where to find more information: °· American Diabetes Association: diabetes.org °· Academy of Nutrition and Dietetics: www.eatright.org °· National Institute of Diabetes and Digestive and Kidney Diseases (NIH): www.niddk.nih.gov °Summary °· A healthy meal plan will help you control your blood glucose and maintain a healthy lifestyle. °· Working with a diet and nutrition specialist (dietitian) can help you make a meal plan that is best for you. °· Keep in mind that carbohydrates (carbs) and alcohol have immediate effects on your blood glucose levels. It is important to count carbs and to use alcohol carefully. °This information is not intended to replace advice given to you by your health care provider. Make sure you discuss any questions you have with your health care provider. °Document Released: 06/14/2005 Document Revised: 08/30/2017 Document Reviewed: 10/22/2016 °Elsevier Patient Education © 2020 [employer] ° °
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
## 2019-04-10 LAB — LIPID PANEL
Chol/HDL Ratio: 3.9 ratio (ref 0.0–5.0)
Cholesterol, Total: 179 mg/dL (ref 100–199)
HDL: 46 mg/dL (ref >39)
LDL Calculated: 117 mg/dL — ABNORMAL HIGH (ref 0–99)
Triglycerides: 81 mg/dL (ref 0–149)
VLDL Cholesterol Cal: 16 mg/dL (ref 5–40)

## 2019-04-10 LAB — CBC WITH DIFFERENTIAL/PLATELET
Basophils Absolute: 0 10*3/uL (ref 0.0–0.2)
Basos: 1 %
EOS (ABSOLUTE): 0.2 10*3/uL (ref 0.0–0.4)
Eos: 4 %
Hematocrit: 39.5 % (ref 37.5–51.0)
Hemoglobin: 13.2 g/dL (ref 13.0–17.7)
Immature Grans (Abs): 0 10*3/uL (ref 0.0–0.1)
Immature Granulocytes: 0 %
Lymphocytes Absolute: 1.7 10*3/uL (ref 0.7–3.1)
Lymphs: 42 %
MCH: 26.4 pg — ABNORMAL LOW (ref 26.6–33.0)
MCHC: 33.4 g/dL (ref 31.5–35.7)
MCV: 79 fL (ref 79–97)
Monocytes Absolute: 0.3 10*3/uL (ref 0.1–0.9)
Monocytes: 8 %
Neutrophils Absolute: 1.8 10*3/uL (ref 1.4–7.0)
Neutrophils: 45 %
Platelets: 195 10*3/uL (ref 150–450)
RBC: 5 x10E6/uL (ref 4.14–5.80)
RDW: 13.6 % (ref 11.6–15.4)
WBC: 4 10*3/uL (ref 3.4–10.8)

## 2019-04-10 LAB — CMP14+EGFR
ALT: 10 IU/L (ref 0–44)
AST: 9 IU/L (ref 0–40)
Albumin/Globulin Ratio: 1.5 (ref 1.2–2.2)
Albumin: 4.4 g/dL (ref 3.8–4.8)
Alkaline Phosphatase: 139 IU/L — ABNORMAL HIGH (ref 39–117)
BUN/Creatinine Ratio: 11 (ref 10–24)
BUN: 11 mg/dL (ref 8–27)
Bilirubin Total: 0.3 mg/dL (ref 0.0–1.2)
CO2: 24 mmol/L (ref 20–29)
Calcium: 9.7 mg/dL (ref 8.6–10.2)
Chloride: 102 mmol/L (ref 96–106)
Creatinine, Ser: 1.02 mg/dL (ref 0.76–1.27)
GFR calc Af Amer: 88 mL/min/{1.73_m2} (ref >59)
GFR calc non Af Amer: 76 mL/min/{1.73_m2} (ref >59)
Globulin, Total: 2.9 g/dL (ref 1.5–4.5)
Glucose: 249 mg/dL — ABNORMAL HIGH (ref 65–99)
Potassium: 4.7 mmol/L (ref 3.5–5.2)
Sodium: 139 mmol/L (ref 134–144)
Total Protein: 7.3 g/dL (ref 6.0–8.5)

## 2019-04-23 ENCOUNTER — Other Ambulatory Visit: Payer: Self-pay

## 2019-04-23 ENCOUNTER — Ambulatory Visit (INDEPENDENT_AMBULATORY_CARE_PROVIDER_SITE_OTHER): Payer: Managed Care, Other (non HMO) | Admitting: Primary Care

## 2019-04-23 ENCOUNTER — Encounter (INDEPENDENT_AMBULATORY_CARE_PROVIDER_SITE_OTHER): Payer: Self-pay | Admitting: Primary Care

## 2019-04-23 VITALS — BP 136/87 | HR 79 | Temp 97.5°F | Ht 71.0 in | Wt 157.2 lb

## 2019-04-23 DIAGNOSIS — H6120 Impacted cerumen, unspecified ear: Secondary | ICD-10-CM

## 2019-04-23 DIAGNOSIS — E119 Type 2 diabetes mellitus without complications: Secondary | ICD-10-CM

## 2019-04-23 DIAGNOSIS — I10 Essential (primary) hypertension: Secondary | ICD-10-CM | POA: Diagnosis not present

## 2019-04-23 DIAGNOSIS — H539 Unspecified visual disturbance: Secondary | ICD-10-CM

## 2019-04-23 DIAGNOSIS — Z131 Encounter for screening for diabetes mellitus: Secondary | ICD-10-CM | POA: Diagnosis not present

## 2019-04-23 LAB — GLUCOSE, POCT (MANUAL RESULT ENTRY): POC Glucose: 194 mg/dl — AB (ref 70–99)

## 2019-04-23 MED ORDER — TRULICITY 0.75 MG/0.5ML ~~LOC~~ SOAJ: 0.7500 mL | SUBCUTANEOUS | 3 refills | Status: DC

## 2019-04-23 NOTE — Patient Instructions (Signed)
Caregiver Guide  Alzheimer disease causes a person to lose the ability to remember things and make decisions. A person who has Alzheimer disease may not be able to take care of himself or herself. He or she may need help with simple tasks. The tips below can help you care for the person. What kind of changes does this condition cause? This condition makes a person:  Forget things.  Feel confused.  Act differently.  Have different moods. These things get worse with time. Tips to help with symptoms  Be calm and patient.  Respond with a simple, short answer.  Avoid correcting the person in a negative way.  Try not to take things personally, even if the person forgets your name.  Do not argue with the person. This may make the person more upset. Tips to lessen frustration  Make appointments and do daily tasks when the person is at his or her best.  Take your time. Simple tasks may take longer. Allow plenty of time to complete tasks.  Limit choices for the person.  Involve the person in what you are doing.  Keep a daily routine.  Avoid new or crowded places, if possible.  Use simple words, short sentences, and a calm voice. Only give one direction at a time.  Buy clothes and shoes that are easy to put on and take off.  Organize medicines in a pillbox for each day of the week.  Keep a calendar in a central location to remind the person of meetings or other activities.  Let people help if they offer. Take a break when needed. Tips to prevent injury  Keep floors clear. Remove rugs, magazine racks, and floor lamps.  Keep hallways well-lit.  Put a handrail and non-slip mat in the bathtub or shower.  Put childproof locks on cabinets that have dangerous items in them. These items include medicine, alcohol, guns, toxic cleaning items, sharp tools, matches, and lighters.  Put locks on doors where the person cannot see or reach them. This helps the person to not wander out  of the house and get lost.  Be prepared for emergencies. Keep a list of emergency phone numbers and addresses close by.  Bracelets may be worn that track location and identify the person as having memory problems. This should be worn at all times for safety. Tips for the future  Discuss financial and legal planning early. People with this disease have trouble managing their money as the disease gets worse. Get help from a professional.  Talk about advance directives, safety, and daily care. Take these steps: ? Create a living will and choose a power of attorney. This is someone who can make decisions for the person with Alzheimer disease when he or she can no longer do so. ? Discuss driving safety and when to stop driving. The person's doctor can help with this. ? If the person lives alone, make sure he or she is safe. Some people need extra help at home. Other people need more care at a nursing home or care center. Where to find support You can find support by joining a support group near you. Some benefits of joining a support group include:  Learning ways to manage stress.  Sharing experiences with others.  Getting emotional comfort and support.  Learning about caregiving as the disease progresses.  Knowing what community resources are available and making use of them. Where to find more information  Alzheimer's Association: [employer] Contact a doctor if:  The  person has a fever.  The person has a sudden behavior change that does not get better with calming strategies.  The person is not able to take care of himself or herself at home.  The person threatens you or anyone else, including himself or herself.  You are no longer able to care for the person. Summary  Alzheimer disease causes a person to forget things and to be confused.  A person who has this condition may not be able to take care of himself or herself.  Take steps to keep the person from getting hurt.  Plan for future care.  You can find support by joining a support group near you. This information is not intended to replace advice given to you by your health care provider. Make sure you discuss any questions you have with your health care provider. Document Released: 12/10/2011 Document Revised: 01/06/2019 Document Reviewed: 09/12/2017 Elsevier Patient Education  2020 [employer].

## 2019-04-23 NOTE — Progress Notes (Signed)
Established Patient Office Visit  Subjective:  Patient ID: Brandon Sullivan, male    DOB: Apr 11, 1951  Age: 68 y.o. MRN: [id number]  CC:  Chief Complaint  Patient presents with  . Cerumen Impaction  . Blood Pressure Check    HPI Brandon HockJohn Shaft presents for management of diabetes and ear lavage. He has significant memory loss very vague in responses. Blood pressure well controlled denies shortness of breath, headaches, chest pain or lower extremity edema.   Past Medical History:  Diagnosis Date  . Back pain   . Medical history unknown     History reviewed. No pertinent surgical history.  History reviewed. No pertinent family history.  Social History   Socioeconomic History  . Marital status: Single    Spouse name: Not on file  . Number of children: Not on file  . Years of education: Not on file  . Highest education level: Not on file  Occupational History  . Not on file  Social Needs  . Financial resource strain: Not on file  . Food insecurity    Worry: Not on file    Inability: Not on file  . Transportation needs    Medical: Not on file    Non-medical: Not on file  Tobacco Use  . Smoking status: Never Smoker  . Smokeless tobacco: Never Used  Substance and Sexual Activity  . Alcohol use: Yes  . Drug use: No  . Sexual activity: Not on file  Lifestyle  . Physical activity    Days per week: Not on file    Minutes per session: Not on file  . Stress: Not on file  Relationships  . Social Musicianconnections    Talks on phone: Not on file    Gets together: Not on file    Attends religious service: Not on file    Active member of club or organization: Not on file    Attends meetings of clubs or organizations: Not on file    Relationship status: Not on file  . Intimate partner violence    Fear of current or ex partner: Not on file    Emotionally abused: Not on file    Physically abused: Not on file    Forced sexual activity: Not on file  Other Topics Concern  . Not on file   Social History Narrative   ** Merged History Encounter **        Outpatient Medications Prior to Visit  Medication Sig Dispense Refill  . acetaminophen (TYLENOL) 325 MG tablet Take 2 tablets (650 mg total) by mouth every 6 (six) hours as needed for mild pain, fever or headache.    . divalproex (DEPAKOTE) 125 MG DR tablet Take 125 mg by mouth 3 (three) times daily.    Marland Kitchen. glipiZIDE (GLUCOTROL) 10 MG tablet Take 1 tablet (10 mg total) by mouth 2 (two) times daily before a meal. 60 tablet 3  . losartan (COZAAR) 50 MG tablet Take 1 tablet (50 mg total) by mouth daily. 30 tablet 3  . metFORMIN (GLUCOPHAGE) 1000 MG tablet Take 1 tablet (1,000 mg total) by mouth 2 (two) times daily with a meal. 180 tablet 3  . metoprolol tartrate (LOPRESSOR) 25 MG tablet Take 1 tablet (25 mg total) by mouth 2 (two) times daily. 60 tablet 3  . insulin aspart (NOVOLOG FLEXPEN) 100 UNIT/ML FlexPen Inject into the skin 3 (three) times daily with meals.    . docusate sodium (COLACE) 100 MG capsule Take 1 capsule (100 mg total)  by mouth 2 (two) times daily. (Patient not taking: Reported on 04/09/2019) 10 capsule 0  . QUEtiapine (SEROQUEL) 100 MG tablet Take 1 tablet (100 mg total) by mouth at bedtime. (Patient not taking: Reported on 04/09/2019) 15 tablet 0  . clonazePAM (KLONOPIN) 1 MG tablet Take 1 tablet (1 mg total) by mouth 2 (two) times daily. (Patient not taking: Reported on 04/09/2019) 30 tablet 0   No facility-administered medications prior to visit.     No Known Allergies  ROS Review of Systems  Constitutional: Positive for fatigue.  HENT: Positive for hearing loss.   Eyes: Positive for visual disturbance.  Genitourinary: Positive for frequency.  Musculoskeletal: Positive for gait problem.       Instable gait      Objective:    Physical Exam  Constitutional: He appears well-developed and well-nourished.  HENT:  Head: Normocephalic.  Neck: Neck supple.  Cardiovascular: Normal rate and regular rhythm.   Pulmonary/Chest: Effort normal and breath sounds normal.  Abdominal: Soft. Bowel sounds are normal.  Musculoskeletal: Normal range of motion.  Neurological: He is alert.  Skin: Skin is warm.    BP 136/87 (BP Location: Right Arm, Patient Position: Sitting, Cuff Size: Normal)   Pulse 79   Temp (!) 97.5 F (36.4 C) (Tympanic)   Ht 5\' 11"  (1.803 m)   Wt 157 lb 3.2 oz (71.3 kg)   SpO2 97%   BMI 21.92 kg/m  Wt Readings from Last 3 Encounters:  04/23/19 157 lb 3.2 oz (71.3 kg)  04/09/19 156 lb 3.2 oz (70.9 kg)  02/10/19 160 lb 11.5 oz (72.9 kg)     Health Maintenance Due  Topic Date Due  . Hepatitis C Screening  03/22/1951  . COLONOSCOPY  10/12/2001    There are no preventive care reminders to display for this patient.  No results found for: TSH Lab Results  Component Value Date   WBC 4.0 04/09/2019   HGB 13.2 04/09/2019   HCT 39.5 04/09/2019   MCV 79 04/09/2019   PLT 195 04/09/2019   Lab Results  Component Value Date   NA 139 04/09/2019   K 4.7 04/09/2019   CO2 24 04/09/2019   GLUCOSE 249 (H) 04/09/2019   BUN 11 04/09/2019   CREATININE 1.02 04/09/2019   BILITOT 0.3 04/09/2019   ALKPHOS 139 (H) 04/09/2019   AST 9 04/09/2019   ALT 10 04/09/2019   PROT 7.3 04/09/2019   ALBUMIN 4.4 04/09/2019   CALCIUM 9.7 04/09/2019   ANIONGAP 13 02/04/2019   Lab Results  Component Value Date   CHOL 179 04/09/2019   Lab Results  Component Value Date   HDL 46 04/09/2019   Lab Results  Component Value Date   LDLCALC 117 (H) 04/09/2019   Lab Results  Component Value Date   TRIG 81 04/09/2019   Lab Results  Component Value Date   CHOLHDL 3.9 04/09/2019   Lab Results  Component Value Date   HGBA1C 8.6 (A) 04/09/2019      Assessment & Plan:   Screening for diabetes mellitus CBG 194 stated he had cookies for breakfast- not sure if this accurate information.  Type 2 diabetes mellitus without complication, without long-term current use of insulin (HCC) A1C 8.6  on follow up re- evaluate and discuss with care giver using a long acting insulin to be given once daily. ADA recommends the following therapeutic goals for glycemic control related to A1c measurements: Goal of therapy: Less than 6.5 hemoglobin A1c.  Reference clinical practice  recommendations. Foods that are high in carbohydrates are the following rice, potatoes, breads, sugars, and pastas.  Reduction in the intake (eating) will assist in lowering your blood sugars. -     Glucose (CBG) 681 Start Trulicity .75 ml weekly   Changes in vision Patient is not able to tell writer when the last time eyes were exam will refer to ophthalmology guideline recommend yearly exam.   Essential hypertension Counseled on blood pressure goal of less than 130/80, low-sodium, DASH diet, medication compliance, 150 minutes of moderate intensity exercise per week. Discussed medication compliance, adverse effects.  Impacted cerumen, unspecified laterality -     Ear Lavage    Meds ordered this encounter  Medications  . Dulaglutide (TRULICITY) 2.75 TZ/0.0FV SOPN    Sig: Inject 0.75 mLs into the skin once a week.    Dispense:  0.5 pen    Refill:  3    Follow-up: Return in about 3 months (around 07/24/2019) for HTN, DM.    Kerin Perna, NP

## 2019-04-27 ENCOUNTER — Other Ambulatory Visit (INDEPENDENT_AMBULATORY_CARE_PROVIDER_SITE_OTHER): Payer: Self-pay | Admitting: Primary Care

## 2019-04-27 MED ORDER — TRULICITY 0.75 MG/0.5ML ~~LOC~~ SOAJ: 0.7500 mg | SUBCUTANEOUS | 3 refills | Status: DC

## 2019-04-27 MED ORDER — BLOOD GLUCOSE METER KIT: PACK | 0 refills | Status: DC

## 2019-04-28 ENCOUNTER — Other Ambulatory Visit (INDEPENDENT_AMBULATORY_CARE_PROVIDER_SITE_OTHER): Payer: Self-pay

## 2019-04-28 DIAGNOSIS — E119 Type 2 diabetes mellitus without complications: Secondary | ICD-10-CM

## 2019-04-28 MED ORDER — BLOOD GLUCOSE METER KIT: PACK | 0 refills | Status: DC

## 2019-04-28 NOTE — Telephone Encounter (Signed)
Prescription printed and faxed to [facility]. Orginal Rx was never received by pharmacy. Nat Christen, CMA

## 2019-05-08 ENCOUNTER — Encounter: Payer: Self-pay | Admitting: Primary Care

## 2019-05-16 ENCOUNTER — Other Ambulatory Visit (INDEPENDENT_AMBULATORY_CARE_PROVIDER_SITE_OTHER): Payer: Self-pay | Admitting: Primary Care

## 2019-05-16 DIAGNOSIS — E119 Type 2 diabetes mellitus without complications: Secondary | ICD-10-CM

## 2019-05-27 ENCOUNTER — Other Ambulatory Visit (INDEPENDENT_AMBULATORY_CARE_PROVIDER_SITE_OTHER): Payer: Self-pay | Admitting: Primary Care

## 2019-06-02 ENCOUNTER — Other Ambulatory Visit (INDEPENDENT_AMBULATORY_CARE_PROVIDER_SITE_OTHER): Payer: Self-pay | Admitting: Primary Care

## 2019-06-02 DIAGNOSIS — R5381 Other malaise: Secondary | ICD-10-CM

## 2019-06-09 ENCOUNTER — Other Ambulatory Visit (INDEPENDENT_AMBULATORY_CARE_PROVIDER_SITE_OTHER): Payer: Self-pay | Admitting: Primary Care

## 2019-06-23 ENCOUNTER — Telehealth (INDEPENDENT_AMBULATORY_CARE_PROVIDER_SITE_OTHER): Payer: Self-pay

## 2019-06-23 NOTE — Telephone Encounter (Signed)
Open in error

## 2019-06-30 ENCOUNTER — Telehealth: Payer: Self-pay

## 2019-06-30 ENCOUNTER — Other Ambulatory Visit (INDEPENDENT_AMBULATORY_CARE_PROVIDER_SITE_OTHER): Payer: Self-pay | Admitting: Primary Care

## 2019-06-30 DIAGNOSIS — R5381 Other malaise: Secondary | ICD-10-CM

## 2019-06-30 NOTE — Telephone Encounter (Signed)
Attempted to contact patient to discuss home health services.  Spoke to his sister, Brandon Sullivan-  [place] on file to speak to her.  She explained that he has been receiving PT at home and they just stopped coming  last week.  She was not at home at the time of this call and was not sure of the agency that provided the care in order to determine who placed the referral.  She said that he is doing well and walking much better. She did not think he needs additional services at this time. She will discuss any needs with PCP at appointment 07/24/2019.

## 2019-07-02 ENCOUNTER — Emergency Department (HOSPITAL_COMMUNITY)
Admission: EM | Admit: 2019-07-02 | Discharge: 2019-07-02 | Disposition: A | Discharge status: Home / Self Care | Payer: Managed Care, Other (non HMO) | Attending: Emergency Medicine | Admitting: Emergency Medicine

## 2019-07-02 ENCOUNTER — Encounter (HOSPITAL_COMMUNITY): Payer: Self-pay | Admitting: Emergency Medicine

## 2019-07-02 ENCOUNTER — Emergency Department (HOSPITAL_COMMUNITY): Payer: Managed Care, Other (non HMO)

## 2019-07-02 ENCOUNTER — Other Ambulatory Visit: Payer: Self-pay

## 2019-07-02 DIAGNOSIS — Z7984 Long term (current) use of oral hypoglycemic drugs: Secondary | ICD-10-CM | POA: Insufficient documentation

## 2019-07-02 DIAGNOSIS — E11649 Type 2 diabetes mellitus with hypoglycemia without coma: Secondary | ICD-10-CM | POA: Diagnosis not present

## 2019-07-02 DIAGNOSIS — R251 Tremor, unspecified: Secondary | ICD-10-CM | POA: Diagnosis not present

## 2019-07-02 DIAGNOSIS — E119 Type 2 diabetes mellitus without complications: Secondary | ICD-10-CM | POA: Insufficient documentation

## 2019-07-02 DIAGNOSIS — Z79899 Other long term (current) drug therapy: Secondary | ICD-10-CM | POA: Insufficient documentation

## 2019-07-02 LAB — COMPREHENSIVE METABOLIC PANEL
ALT: 23 U/L (ref 0–44)
AST: 33 U/L (ref 15–41)
Albumin: 3.9 g/dL (ref 3.5–5.0)
Alkaline Phosphatase: 99 U/L (ref 38–126)
Anion gap: 13 (ref 5–15)
BUN: 17 mg/dL (ref 8–23)
CO2: 23 mmol/L (ref 22–32)
Calcium: 9.7 mg/dL (ref 8.9–10.3)
Chloride: 99 mmol/L (ref 98–111)
Creatinine, Ser: 1.23 mg/dL (ref 0.61–1.24)
GFR calc Af Amer: >60 mL/min (ref >60)
GFR calc non Af Amer: >60 mL/min (ref >60)
Glucose, Bld: 74 mg/dL (ref 70–99)
Potassium: 5.5 mmol/L — ABNORMAL HIGH (ref 3.5–5.1)
Sodium: 135 mmol/L (ref 135–145)
Total Bilirubin: 0.6 mg/dL (ref 0.3–1.2)
Total Protein: 7.3 g/dL (ref 6.5–8.1)

## 2019-07-02 LAB — CBC
HCT: 38.8 % — ABNORMAL LOW (ref 39.0–52.0)
Hemoglobin: 13.6 g/dL (ref 13.0–17.0)
MCH: 26.8 pg (ref 26.0–34.0)
MCHC: 35.1 g/dL (ref 30.0–36.0)
MCV: 76.4 fL — ABNORMAL LOW (ref 80.0–100.0)
Platelets: 232 10*3/uL (ref 150–400)
RBC: 5.08 MIL/uL (ref 4.22–5.81)
RDW: 13.5 % (ref 11.5–15.5)
WBC: 5.7 10*3/uL (ref 4.0–10.5)
nRBC: 0 % (ref 0.0–0.2)

## 2019-07-02 LAB — CBG MONITORING, ED
Glucose-Capillary: 72 mg/dL (ref 70–99)
Glucose-Capillary: 92 mg/dL (ref 70–99)

## 2019-07-02 LAB — VALPROIC ACID LEVEL: Valproic Acid Lvl: <10 ug/mL — ABNORMAL LOW (ref 50.0–100.0)

## 2019-07-02 NOTE — ED Notes (Signed)
Patient given sandwich and orange juice.  
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
## 2019-07-02 NOTE — ED Notes (Signed)
Pt CBG was 92, notified Katie(RN)

## 2019-07-02 NOTE — ED Notes (Signed)
Pt CBG was 72, notified Katie(RN)
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
## 2019-07-13 ENCOUNTER — Other Ambulatory Visit (INDEPENDENT_AMBULATORY_CARE_PROVIDER_SITE_OTHER): Payer: Self-pay | Admitting: Primary Care

## 2019-07-13 MED ORDER — METFORMIN HCL 1000 MG PO TABS: 1000.0000 mg | ORAL_TABLET | Freq: Two times a day (BID) | ORAL | 0 refills | Status: DC

## 2019-07-14 ENCOUNTER — Other Ambulatory Visit (INDEPENDENT_AMBULATORY_CARE_PROVIDER_SITE_OTHER): Payer: Self-pay | Admitting: Primary Care

## 2019-07-14 MED ORDER — LOSARTAN POTASSIUM 50 MG PO TABS: 50.0000 mg | ORAL_TABLET | Freq: Every day | ORAL | 3 refills | Status: DC

## 2019-07-14 MED ORDER — GLIPIZIDE 10 MG PO TABS: 10.0000 mg | ORAL_TABLET | Freq: Two times a day (BID) | ORAL | 3 refills | Status: DC

## 2019-07-24 ENCOUNTER — Encounter (INDEPENDENT_AMBULATORY_CARE_PROVIDER_SITE_OTHER): Payer: Self-pay | Admitting: Primary Care

## 2019-07-24 ENCOUNTER — Other Ambulatory Visit: Payer: Self-pay

## 2019-07-24 ENCOUNTER — Ambulatory Visit (INDEPENDENT_AMBULATORY_CARE_PROVIDER_SITE_OTHER): Payer: Managed Care, Other (non HMO) | Admitting: Primary Care

## 2019-07-24 VITALS — BP 126/80 | HR 94 | Temp 97.3°F | Ht 70.0 in | Wt 162.2 lb

## 2019-07-24 DIAGNOSIS — Z1211 Encounter for screening for malignant neoplasm of colon: Secondary | ICD-10-CM

## 2019-07-24 DIAGNOSIS — Z1159 Encounter for screening for other viral diseases: Secondary | ICD-10-CM | POA: Diagnosis not present

## 2019-07-24 DIAGNOSIS — I10 Essential (primary) hypertension: Secondary | ICD-10-CM

## 2019-07-24 DIAGNOSIS — E119 Type 2 diabetes mellitus without complications: Secondary | ICD-10-CM | POA: Diagnosis not present

## 2019-07-24 DIAGNOSIS — Z23 Encounter for immunization: Secondary | ICD-10-CM | POA: Diagnosis not present

## 2019-07-24 LAB — POCT GLYCOSYLATED HEMOGLOBIN (HGB A1C): Hemoglobin A1C: 6.1 % — AB (ref 4.0–5.6)

## 2019-07-24 LAB — GLUCOSE, POCT (MANUAL RESULT ENTRY): POC Glucose: 225 mg/dl — AB (ref 70–99)

## 2019-07-24 NOTE — Progress Notes (Signed)
Established Patient Office Visit  Subjective:  Patient ID: Brandon Sullivan, male    DOB: 07/14/1951  Age: 69 y.o. MRN: [id number]  CC:  Chief Complaint  Patient presents with  . Follow-up    HTN/DM    HPI Brandon Sullivan presents for management of hypertension today 126/80 unremarkable well controlled and he denies shortness of breath, headaches, chest pain or lower extremity edema and diabetes. A1C 5.6 well controlled does have increase in thirst. Denies any other complaints or problems other than listed in ROS.  Past Medical History:  Diagnosis Date  . Back pain   . Medical history unknown   . 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 No pertinent surgical history.  History reviewed. No pertinent family history.  Social History   Socioeconomic History  . Marital status: Single    Spouse name: Not on file  . Number of children: Not on file  . Years of education: Not on file  . Highest education level: Not on file  Occupational History  . Not on file  Social Needs  . Financial resource strain: Not on file  . Food insecurity    Worry: Not on file    Inability: Not on file  . Transportation needs    Medical: Not on file    Non-medical: Not on file  Tobacco Use  . Smoking status: Never Smoker  . Smokeless tobacco: Never Used  Substance and Sexual Activity  . Alcohol use: Yes  . Drug use: No  . Sexual activity: Not on file  Lifestyle  . Physical activity    Days per week: Not on file    Minutes per session: Not on file  . Stress: Not on file  Relationships  . Social Herbalist on phone: Not on file    Gets together: Not on file    Attends religious service: Not on file    Active member of club or organization: Not on file    Attends meetings of clubs or organizations: Not on file    Relationship status: Not on file  . Intimate partner violence    Fear of current or ex partner: Not on file    Emotionally abused: Not on file     Physically abused: Not on file    Forced sexual activity: Not on file  Other Topics Concern  . Not on file  Social History Narrative   ** Merged History Encounter **        Outpatient Medications Prior to Visit  Medication Sig Dispense Refill  . blood glucose meter kit and supplies Dispense based on patient and insurance preference. Use up to four times daily as directed. (FOR ICD-10 E10.9, E11.9). 1 each 0  . divalproex (DEPAKOTE) 125 MG DR tablet Take 125 mg by mouth 3 (three) times daily.    Marland Kitchen glipiZIDE (GLUCOTROL) 10 MG tablet Take 1 tablet (10 mg total) by mouth 2 (two) times daily before a meal. 60 tablet 3  . losartan (COZAAR) 50 MG tablet Take 1 tablet (50 mg total) by mouth daily. 30 tablet 3  . metFORMIN (GLUCOPHAGE) 1000 MG tablet Take 1 tablet (1,000 mg total) by mouth 2 (two) times daily with a meal. 180 tablet 0  . metoprolol tartrate (LOPRESSOR) 25 MG tablet Take 1 tablet (25 mg total) by mouth 2 (two) times daily. 60 tablet 3  . OneTouch Delica Lancets 10X MISC USE AS DIRECTED 100 each 0  . acetaminophen (  TYLENOL) 325 MG tablet Take 2 tablets (650 mg total) by mouth every 6 (six) hours as needed for mild pain, fever or headache.    . docusate sodium (COLACE) 100 MG capsule Take 1 capsule (100 mg total) by mouth 2 (two) times daily. (Patient not taking: Reported on 04/09/2019) 10 capsule 0  . QUEtiapine (SEROQUEL) 100 MG tablet Take 1 tablet (100 mg total) by mouth at bedtime. (Patient not taking: Reported on 04/09/2019) 15 tablet 0   No facility-administered medications prior to visit.     No Known Allergies  ROS Review of Systems  Eyes: Positive for visual disturbance.  Endocrine: Positive for polydipsia.  Genitourinary: Positive for dysuria.  All other systems reviewed and are negative.     Objective:    Physical Exam  Constitutional: He is oriented to person, place, and time. He appears well-developed and well-nourished.  Neck: Normal range of motion.   Cardiovascular: Normal rate and regular rhythm.  Pulmonary/Chest: Effort normal and breath sounds normal.  Abdominal: Soft. Bowel sounds are normal. He exhibits distension.  Neurological: He is alert and oriented to person, place, and time.  Skin: Skin is warm and dry.  Psychiatric: He has a normal mood and affect.    BP 126/80 (BP Location: Right Arm, Patient Position: Sitting, Cuff Size: Normal)   Pulse 94   Temp (!) 97.3 F (36.3 C) (Temporal)   Ht 5' 10"  (1.778 m)   Wt 162 lb 3.2 oz (73.6 kg)   SpO2 97%   BMI 23.27 kg/m  Wt Readings from Last 3 Encounters:  07/24/19 162 lb 3.2 oz (73.6 kg)  07/02/19 170 lb (77.1 kg)  04/23/19 157 lb 3.2 oz (71.3 kg)     Health Maintenance Due  Topic Date Due  . COLONOSCOPY  10/12/2001    There are no preventive care reminders to display for this patient.  No results found for: TSH Lab Results  Component Value Date   WBC 5.7 07/02/2019   HGB 13.6 07/02/2019   HCT 38.8 (L) 07/02/2019   MCV 76.4 (L) 07/02/2019   PLT 232 07/02/2019   Lab Results  Component Value Date   NA 135 07/02/2019   K 5.5 (H) 07/02/2019   CO2 23 07/02/2019   GLUCOSE 74 07/02/2019   BUN 17 07/02/2019   CREATININE 1.23 07/02/2019   BILITOT 0.6 07/02/2019   ALKPHOS 99 07/02/2019   AST 33 07/02/2019   ALT 23 07/02/2019   PROT 7.3 07/02/2019   ALBUMIN 3.9 07/02/2019   CALCIUM 9.7 07/02/2019   ANIONGAP 13 07/02/2019   Lab Results  Component Value Date   CHOL 179 04/09/2019   Lab Results  Component Value Date   HDL 46 04/09/2019   Lab Results  Component Value Date   LDLCALC 117 (H) 04/09/2019   Lab Results  Component Value Date   TRIG 81 04/09/2019   Lab Results  Component Value Date   CHOLHDL 3.9 04/09/2019   Lab Results  Component Value Date   HGBA1C 6.1 (A) 07/24/2019      Assessment & Plan:  Lyn was seen today for follow-up.  Diagnoses and all orders for this visit:  Type 2 diabetes mellitus without complication, without  long-term current use of insulin (HCC) Well controlled on glucotrol 78m and metformin [id number] continue current regiment. On follow up will consider decreasing medication if A1C maintain at goal < 6.5  -     HgB A1c 5.6  -     Glucose (  CBG)  Colon cancer screening -     Fecal occult blood, imunochemical; Future  Need for hepatitis C screening test -     Hepatitis C Antibody  Essential hypertension Well controlled on losartan 50 mg and metoprolol 75m twice daily. Continue to maintain low-sodium diet, medication compliance and 150 minutes of moderate intensity exercise per week.  No orders of the defined types were placed in this encounter.   Follow-up: Return in about 6 months (around 01/22/2020) for  in person.    MKerin Perna NP

## 2019-07-24 NOTE — Patient Instructions (Signed)
Health Maintenance After Age 67 After age 67, you are at a higher risk for certain long-term diseases and infections as well as injuries from falls. Falls are a major cause of broken bones and head injuries in people who are older than age 67. Getting regular preventive care can help to keep you healthy and well. Preventive care includes getting regular testing and making lifestyle changes as recommended by your health care provider. Talk with your health care provider about:  Which screenings and tests you should have. A screening is a test that checks for a disease when you have no symptoms.  A diet and exercise plan that is right for you. What should I know about screenings and tests to prevent falls? Screening and testing are the best ways to find a health problem early. Early diagnosis and treatment give you the best chance of managing medical conditions that are common after age 67. Certain conditions and lifestyle choices may make you more likely to have a fall. Your health care provider may recommend:  Regular vision checks. Poor vision and conditions such as cataracts can make you more likely to have a fall. If you wear glasses, make sure to get your prescription updated if your vision changes.  Medicine review. Work with your health care provider to regularly review all of the medicines you are taking, including over-the-counter medicines. Ask your health care provider about any side effects that may make you more likely to have a fall. Tell your health care provider if any medicines that you take make you feel dizzy or sleepy.  Osteoporosis screening. Osteoporosis is a condition that causes the bones to get weaker. This can make the bones weak and cause them to break more easily.  Blood pressure screening. Blood pressure changes and medicines to control blood pressure can make you feel dizzy.  Strength and balance checks. Your health care provider may recommend certain tests to check your  strength and balance while standing, walking, or changing positions.  Foot health exam. Foot pain and numbness, as well as not wearing proper footwear, can make you more likely to have a fall.  Depression screening. You may be more likely to have a fall if you have a fear of falling, feel emotionally low, or feel unable to do activities that you used to do.  Alcohol use screening. Using too much alcohol can affect your balance and may make you more likely to have a fall. What actions can I take to lower my risk of falls? General instructions  Talk with your health care provider about your risks for falling. Tell your health care provider if: ? You fall. Be sure to tell your health care provider about all falls, even ones that seem minor. ? You feel dizzy, sleepy, or off-balance.  Take over-the-counter and prescription medicines only as told by your health care provider. These include any supplements.  Eat a healthy diet and maintain a healthy weight. A healthy diet includes low-fat dairy products, low-fat (lean) meats, and fiber from whole grains, beans, and lots of fruits and vegetables. Home safety  Remove any tripping hazards, such as rugs, cords, and clutter.  Install safety equipment such as grab bars in bathrooms and safety rails on stairs.  Keep rooms and walkways well-lit. Activity   Follow a regular exercise program to stay fit. This will help you maintain your balance. Ask your health care provider what types of exercise are appropriate for you.  If you need a cane or   walker, use it as recommended by your health care provider.  Wear supportive shoes that have nonskid soles. Lifestyle  Do not drink alcohol if your health care provider tells you not to drink.  If you drink alcohol, limit how much you have: ? 0-1 drink a day for women. ? 0-2 drinks a day for men.  Be aware of how much alcohol is in your drink. In the [place] one drink equals one typical bottle of beer (12  oz), one-half glass of wine (5 oz), or one shot of hard liquor (1 oz).  Do not use any products that contain nicotine or tobacco, such as cigarettes and e-cigarettes. If you need help quitting, ask your health care provider. Summary  Having a healthy lifestyle and getting preventive care can help to protect your health and wellness after age 67.  Screening and testing are the best way to find a health problem early and help you avoid having a fall. Early diagnosis and treatment give you the best chance for managing medical conditions that are more common for people who are older than age 67.  Falls are a major cause of broken bones and head injuries in people who are older than age 67. Take precautions to prevent a fall at home.  Work with your health care provider to learn what changes you can make to improve your health and wellness and to prevent falls. This information is not intended to replace advice given to you by your health care provider. Make sure you discuss any questions you have with your health care provider. Document Released: 07/31/2017 Document Revised: 01/08/2019 Document Reviewed: 07/31/2017 Elsevier Patient Education  2020 [employer] Inc[employer]  

## 2019-07-25 LAB — HEPATITIS C ANTIBODY: Hep C Virus Ab: <0.1 s/co ratio (ref 0.0–0.9)

## 2019-07-29 ENCOUNTER — Other Ambulatory Visit (INDEPENDENT_AMBULATORY_CARE_PROVIDER_SITE_OTHER): Payer: Self-pay | Admitting: Family Medicine

## 2019-07-29 ENCOUNTER — Other Ambulatory Visit (INDEPENDENT_AMBULATORY_CARE_PROVIDER_SITE_OTHER): Payer: Self-pay | Admitting: Primary Care

## 2019-07-29 ENCOUNTER — Telehealth (INDEPENDENT_AMBULATORY_CARE_PROVIDER_SITE_OTHER): Payer: Self-pay

## 2019-07-29 DIAGNOSIS — R5381 Other malaise: Secondary | ICD-10-CM

## 2019-07-29 DIAGNOSIS — E119 Type 2 diabetes mellitus without complications: Secondary | ICD-10-CM

## 2019-07-29 NOTE — Telephone Encounter (Signed)
-----   Message from Kerin Perna, NP sent at 07/28/2019  7:51 PM EDT ----- Negative for Hepatitis C

## 2019-07-29 NOTE — Telephone Encounter (Signed)
Spoke with patients sister. She is aware that hepatitis C is negative. She asked if an order for home health aid can be placed. Nat Christen, CMA

## 2019-07-29 NOTE — Telephone Encounter (Signed)
Orders sent for [facility]

## 2019-08-10 ENCOUNTER — Other Ambulatory Visit (INDEPENDENT_AMBULATORY_CARE_PROVIDER_SITE_OTHER): Payer: Self-pay | Admitting: Primary Care

## 2019-08-10 NOTE — Telephone Encounter (Signed)
FWD to PCP. Brandon Sullivan S Senai Ramnath, CMA  

## 2019-09-16 ENCOUNTER — Other Ambulatory Visit (INDEPENDENT_AMBULATORY_CARE_PROVIDER_SITE_OTHER): Payer: Self-pay | Admitting: Primary Care

## 2019-09-22 ENCOUNTER — Other Ambulatory Visit (INDEPENDENT_AMBULATORY_CARE_PROVIDER_SITE_OTHER): Payer: Self-pay | Admitting: Primary Care

## 2019-09-22 NOTE — Telephone Encounter (Signed)
FWD to PCP

## 2019-10-05 ENCOUNTER — Other Ambulatory Visit (INDEPENDENT_AMBULATORY_CARE_PROVIDER_SITE_OTHER): Payer: Self-pay | Admitting: Primary Care

## 2019-10-05 NOTE — Telephone Encounter (Signed)
FWD to PCP

## 2019-10-20 ENCOUNTER — Ambulatory Visit (INDEPENDENT_AMBULATORY_CARE_PROVIDER_SITE_OTHER): Payer: Managed Care, Other (non HMO) | Admitting: Primary Care

## 2019-10-29 ENCOUNTER — Ambulatory Visit (INDEPENDENT_AMBULATORY_CARE_PROVIDER_SITE_OTHER): Payer: Managed Care, Other (non HMO) | Admitting: Primary Care

## 2019-12-15 DIAGNOSIS — Z961 Presence of intraocular lens: Secondary | ICD-10-CM | POA: Diagnosis not present

## 2020-01-18 ENCOUNTER — Other Ambulatory Visit (INDEPENDENT_AMBULATORY_CARE_PROVIDER_SITE_OTHER): Payer: Self-pay | Admitting: Primary Care

## 2020-01-18 NOTE — Telephone Encounter (Signed)
Patient needs appt
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
## 2020-02-21 ENCOUNTER — Other Ambulatory Visit (INDEPENDENT_AMBULATORY_CARE_PROVIDER_SITE_OTHER): Payer: Self-pay | Admitting: Primary Care

## 2020-02-22 NOTE — Telephone Encounter (Signed)
Sent to PCP ?

## 2020-03-17 ENCOUNTER — Other Ambulatory Visit (INDEPENDENT_AMBULATORY_CARE_PROVIDER_SITE_OTHER): Payer: Self-pay | Admitting: Primary Care

## 2020-04-07 ENCOUNTER — Other Ambulatory Visit (INDEPENDENT_AMBULATORY_CARE_PROVIDER_SITE_OTHER): Payer: Self-pay | Admitting: Primary Care

## 2020-04-07 NOTE — Telephone Encounter (Signed)
Please refill if appropriate

## 2020-04-07 NOTE — Telephone Encounter (Signed)
Requested medication (s) are due for refill today: yes   Requested medication (s) are on the active medication list: yes Last refill:  03/14/2020  Future visit scheduled: no  Notes to clinic: Patient is overdue for follow up Looks like patient has been notified several times Please advise   Requested Prescriptions  Pending Prescriptions Disp Refills   losartan (COZAAR) 50 MG tablet [Pharmacy Med Name: LOSARTAN POTASSIUM 50 MG TAB] 30 tablet 3    Sig: TAKE 1 TABLET BY MOUTH EVERY DAY      Cardiovascular:  Angiotensin Receptor Blockers Failed - 04/07/2020  7:13 AM      Failed - Cr in normal range and within 180 days    Creatinine, Ser  Date Value Ref Range Status  07/02/2019 1.23 0.61 - 1.24 mg/dL Final          Failed - K in normal range and within 180 days    Potassium  Date Value Ref Range Status  07/02/2019 5.5 (H) 3.5 - 5.1 mmol/L Final    Comment:    HEMOLYSIS AT THIS LEVEL MAY AFFECT RESULT          Failed - Valid encounter within last 6 months    Recent Outpatient Visits           8 months ago Type 2 diabetes mellitus without complication, without long-term current use of insulin (HCC)   [facility] Grayce Sessions, NP   11 months ago Screening for diabetes mellitus   [facility] Grayce Sessions, NP   12 months ago Screening for diabetes mellitus   [facility] Grayce Sessions, NP              Passed - Patient is not pregnant      Passed - Last BP in normal range    BP Readings from Last 1 Encounters:  07/24/19 126/80

## 2020-05-20 ENCOUNTER — Ambulatory Visit (INDEPENDENT_AMBULATORY_CARE_PROVIDER_SITE_OTHER): Payer: Managed Care, Other (non HMO) | Admitting: Primary Care

## 2020-05-27 ENCOUNTER — Other Ambulatory Visit (INDEPENDENT_AMBULATORY_CARE_PROVIDER_SITE_OTHER): Payer: Self-pay | Admitting: Primary Care

## 2020-06-03 ENCOUNTER — Encounter: Payer: Self-pay | Admitting: Internal Medicine

## 2020-06-03 ENCOUNTER — Other Ambulatory Visit (INDEPENDENT_AMBULATORY_CARE_PROVIDER_SITE_OTHER): Payer: Self-pay | Admitting: Primary Care

## 2020-06-03 ENCOUNTER — Ambulatory Visit (INDEPENDENT_AMBULATORY_CARE_PROVIDER_SITE_OTHER): Payer: Medicare HMO | Admitting: Internal Medicine

## 2020-06-03 VITALS — BP 122/70 | HR 66 | Resp 12 | Ht 64.5 in | Wt 156.0 lb

## 2020-06-03 DIAGNOSIS — E119 Type 2 diabetes mellitus without complications: Secondary | ICD-10-CM | POA: Diagnosis not present

## 2020-06-03 DIAGNOSIS — E782 Mixed hyperlipidemia: Secondary | ICD-10-CM | POA: Diagnosis not present

## 2020-06-03 DIAGNOSIS — I10 Essential (primary) hypertension: Secondary | ICD-10-CM

## 2020-06-03 DIAGNOSIS — S069X0D Unspecified intracranial injury without loss of consciousness, subsequent encounter: Secondary | ICD-10-CM | POA: Diagnosis not present

## 2020-06-03 LAB — GLUCOSE, POCT (MANUAL RESULT ENTRY): POC Glucose: 104 mg/dl — AB (ref 70–99)

## 2020-06-03 MED ORDER — METOPROLOL TARTRATE 25 MG PO TABS: 25.0000 mg | ORAL_TABLET | Freq: Two times a day (BID) | ORAL | 11 refills | Status: DC

## 2020-06-03 MED ORDER — METFORMIN HCL 1000 MG PO TABS: 1000.0000 mg | ORAL_TABLET | Freq: Two times a day (BID) | ORAL | 11 refills | Status: DC

## 2020-06-03 MED ORDER — LOSARTAN POTASSIUM 50 MG PO TABS: ORAL_TABLET | ORAL | 11 refills | Status: DC

## 2020-06-03 MED ORDER — GLIPIZIDE 10 MG PO TABS: ORAL_TABLET | ORAL | 11 refills | Status: DC

## 2020-06-03 MED ORDER — ASPIRIN EC 81 MG PO TBEC: 81.0000 mg | DELAYED_RELEASE_TABLET | Freq: Every day | ORAL | 11 refills | Status: DC
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
## 2020-06-03 NOTE — Telephone Encounter (Signed)
Notes to clinic:  Patient has appointment today    Requested Prescriptions  Pending Prescriptions Disp Refills   losartan (COZAAR) 50 MG tablet [Pharmacy Med Name: LOSARTAN POTASSIUM 50 MG TAB] 90 tablet 1    Sig: TAKE 1 TABLET BY MOUTH EVERY DAY      Cardiovascular:  Angiotensin Receptor Blockers Failed - 06/03/2020  9:46 AM      Failed - Cr in normal range and within 180 days    Creatinine, Ser  Date Value Ref Range Status  07/02/2019 1.23 0.61 - 1.24 mg/dL Final          Failed - K in normal range and within 180 days    Potassium  Date Value Ref Range Status  07/02/2019 5.5 (H) 3.5 - 5.1 mmol/L Final    Comment:    HEMOLYSIS AT THIS LEVEL MAY AFFECT RESULT          Failed - Valid encounter within last 6 months    Recent Outpatient Visits           10 months ago Type 2 diabetes mellitus without complication, without long-term current use of insulin ([place])   [place] [facility] Kerin Perna, NP   1 year ago Screening for diabetes mellitus   [place], NP   1 year ago Screening for diabetes mellitus   [place], [place], NP              Passed - Patient is not pregnant      Passed - Last BP in normal range    BP Readings from Last 1 Encounters:  07/24/19 126/80            metFORMIN (GLUCOPHAGE) 1000 MG tablet [Pharmacy Med Name: METFORMIN HCL 1,000 MG TABLET] 180 tablet 0    Sig: Take 1 tablet (1,000 mg total) by mouth 2 (two) times daily with a meal.      Endocrinology:  Diabetes - Biguanides Failed - 06/03/2020  9:46 AM      Failed - HBA1C is between 0 and 7.9 and within 180 days    Hemoglobin A1C  Date Value Ref Range Status  07/24/2019 6.1 (A) 4.0 - 5.6 % Final          Failed - Valid encounter within last 6 months    Recent Outpatient Visits           10 months ago Type 2 diabetes mellitus without complication, without long-term current use of  insulin ([place])   [place] [facility] Kerin Perna, NP   1 year ago Screening for diabetes mellitus   [place] Kerin Perna, NP   1 year ago Screening for diabetes mellitus   [place] Kerin Perna, NP              Passed - Cr in normal range and within 360 days    Creatinine, Ser  Date Value Ref Range Status  07/02/2019 1.23 0.61 - 1.24 mg/dL Final          Passed - eGFR in normal range and within 360 days    GFR calc Af Amer  Date Value Ref Range Status  07/02/2019 >60 >60 mL/min Final   GFR calc non Af Amer  Date Value Ref Range Status  07/02/2019 >60 >60 mL/min Final  metoprolol tartrate (LOPRESSOR) 25 MG tablet [Pharmacy Med Name: METOPROLOL TARTRATE 25 MG TAB] 60 tablet 3    Sig: TAKE 1 TABLET BY MOUTH TWICE A DAY      Cardiovascular:  Beta Blockers Failed - 06/03/2020  9:46 AM      Failed - Valid encounter within last 6 months    Recent Outpatient Visits           10 months ago Type 2 diabetes mellitus without complication, without long-term current use of insulin ([place])   [place] [facility] Kerin Perna, NP   1 year ago Screening for diabetes mellitus   [place] Kerin Perna, NP   1 year ago Screening for diabetes mellitus   [place] Kerin Perna, NP              Passed - Last BP in normal range    BP Readings from Last 1 Encounters:  07/24/19 126/80          Passed - Last Heart Rate in normal range    Pulse Readings from Last 1 Encounters:  07/24/19 94

## 2020-06-10 ENCOUNTER — Other Ambulatory Visit: Payer: Medicare HMO

## 2020-06-13 ENCOUNTER — Other Ambulatory Visit (INDEPENDENT_AMBULATORY_CARE_PROVIDER_SITE_OTHER): Payer: Medicare HMO

## 2020-06-13 DIAGNOSIS — E119 Type 2 diabetes mellitus without complications: Secondary | ICD-10-CM | POA: Diagnosis not present

## 2020-06-13 DIAGNOSIS — Z79899 Other long term (current) drug therapy: Secondary | ICD-10-CM

## 2020-06-13 DIAGNOSIS — Z125 Encounter for screening for malignant neoplasm of prostate: Secondary | ICD-10-CM | POA: Diagnosis not present

## 2020-06-13 DIAGNOSIS — E782 Mixed hyperlipidemia: Secondary | ICD-10-CM | POA: Diagnosis not present

## 2020-06-14 LAB — COMPREHENSIVE METABOLIC PANEL
ALT: 16 IU/L (ref 0–44)
AST: 19 IU/L (ref 0–40)
Albumin/Globulin Ratio: 1.8 (ref 1.2–2.2)
Albumin: 4.6 g/dL (ref 3.8–4.8)
Alkaline Phosphatase: 113 IU/L (ref 44–121)
BUN/Creatinine Ratio: 11 (ref 10–24)
BUN: 18 mg/dL (ref 8–27)
Bilirubin Total: 0.3 mg/dL (ref 0.0–1.2)
CO2: 26 mmol/L (ref 20–29)
Calcium: 9.4 mg/dL (ref 8.6–10.2)
Chloride: 101 mmol/L (ref 96–106)
Creatinine, Ser: 1.58 mg/dL — ABNORMAL HIGH (ref 0.76–1.27)
GFR calc Af Amer: 51 mL/min/{1.73_m2} — ABNORMAL LOW (ref >59)
GFR calc non Af Amer: 44 mL/min/{1.73_m2} — ABNORMAL LOW (ref >59)
Globulin, Total: 2.6 g/dL (ref 1.5–4.5)
Glucose: 74 mg/dL (ref 65–99)
Potassium: 5 mmol/L (ref 3.5–5.2)
Sodium: 139 mmol/L (ref 134–144)
Total Protein: 7.2 g/dL (ref 6.0–8.5)

## 2020-06-14 LAB — LIPID PANEL W/O CHOL/HDL RATIO
Cholesterol, Total: 143 mg/dL (ref 100–199)
HDL: 46 mg/dL (ref >39)
LDL Chol Calc (NIH): 85 mg/dL (ref 0–99)
Triglycerides: 59 mg/dL (ref 0–149)
VLDL Cholesterol Cal: 12 mg/dL (ref 5–40)

## 2020-06-14 LAB — CBC WITH DIFFERENTIAL/PLATELET
Basophils Absolute: 0.1 10*3/uL (ref 0.0–0.2)
Basos: 1 %
EOS (ABSOLUTE): 0.2 10*3/uL (ref 0.0–0.4)
Eos: 3 %
Hematocrit: 33.3 % — ABNORMAL LOW (ref 37.5–51.0)
Hemoglobin: 11 g/dL — ABNORMAL LOW (ref 13.0–17.7)
Immature Grans (Abs): 0 10*3/uL (ref 0.0–0.1)
Immature Granulocytes: 0 %
Lymphocytes Absolute: 2.5 10*3/uL (ref 0.7–3.1)
Lymphs: 44 %
MCH: 26.5 pg — ABNORMAL LOW (ref 26.6–33.0)
MCHC: 33 g/dL (ref 31.5–35.7)
MCV: 80 fL (ref 79–97)
Monocytes Absolute: 0.3 10*3/uL (ref 0.1–0.9)
Monocytes: 6 %
Neutrophils Absolute: 2.6 10*3/uL (ref 1.4–7.0)
Neutrophils: 46 %
Platelets: 262 10*3/uL (ref 150–450)
RBC: 4.15 x10E6/uL (ref 4.14–5.80)
RDW: 14 % (ref 11.6–15.4)
WBC: 5.7 10*3/uL (ref 3.4–10.8)

## 2020-06-14 LAB — PSA: Prostate Specific Ag, Serum: 6.6 ng/mL — ABNORMAL HIGH (ref 0.0–4.0)

## 2020-06-14 LAB — MICROALBUMIN / CREATININE URINE RATIO
Creatinine, Urine: 37.1 mg/dL
Microalb/Creat Ratio: 89 mg/g creat — ABNORMAL HIGH (ref 0–29)
Microalbumin, Urine: 33.1 ug/mL

## 2020-06-14 LAB — HGB A1C W/O EAG: Hgb A1c MFr Bld: 5.8 % — ABNORMAL HIGH (ref 4.8–5.6)

## 2020-06-22 ENCOUNTER — Other Ambulatory Visit (INDEPENDENT_AMBULATORY_CARE_PROVIDER_SITE_OTHER): Payer: Self-pay | Admitting: Primary Care

## 2020-06-22 MED ORDER — ATORVASTATIN CALCIUM 20 MG PO TABS: 20.0000 mg | ORAL_TABLET | Freq: Every day | ORAL | 11 refills | Status: DC

## 2020-06-28 ENCOUNTER — Other Ambulatory Visit: Payer: Self-pay

## 2020-06-28 ENCOUNTER — Encounter (INDEPENDENT_AMBULATORY_CARE_PROVIDER_SITE_OTHER): Payer: Self-pay

## 2020-06-28 DIAGNOSIS — H35033 Hypertensive retinopathy, bilateral: Secondary | ICD-10-CM | POA: Diagnosis not present

## 2020-06-28 DIAGNOSIS — H25812 Combined forms of age-related cataract, left eye: Secondary | ICD-10-CM | POA: Diagnosis not present

## 2020-06-28 DIAGNOSIS — Z961 Presence of intraocular lens: Secondary | ICD-10-CM | POA: Diagnosis not present

## 2020-06-28 DIAGNOSIS — E113493 Type 2 diabetes mellitus with severe nonproliferative diabetic retinopathy without macular edema, bilateral: Secondary | ICD-10-CM | POA: Diagnosis not present

## 2020-06-28 LAB — HM DIABETES EYE EXAM

## 2020-07-05 ENCOUNTER — Other Ambulatory Visit: Payer: Medicare HMO

## 2020-07-06 ENCOUNTER — Ambulatory Visit (INDEPENDENT_AMBULATORY_CARE_PROVIDER_SITE_OTHER): Payer: Medicare HMO | Admitting: Ophthalmology

## 2020-07-06 ENCOUNTER — Encounter (INDEPENDENT_AMBULATORY_CARE_PROVIDER_SITE_OTHER): Payer: Self-pay | Admitting: Ophthalmology

## 2020-07-06 ENCOUNTER — Other Ambulatory Visit: Payer: Self-pay

## 2020-07-06 DIAGNOSIS — E113492 Type 2 diabetes mellitus with severe nonproliferative diabetic retinopathy without macular edema, left eye: Secondary | ICD-10-CM | POA: Diagnosis not present

## 2020-07-06 DIAGNOSIS — E113411 Type 2 diabetes mellitus with severe nonproliferative diabetic retinopathy with macular edema, right eye: Secondary | ICD-10-CM | POA: Diagnosis not present

## 2020-07-06 DIAGNOSIS — H2512 Age-related nuclear cataract, left eye: Secondary | ICD-10-CM | POA: Diagnosis not present

## 2020-07-06 DIAGNOSIS — Z961 Presence of intraocular lens: Secondary | ICD-10-CM | POA: Diagnosis not present

## 2020-07-06 HISTORY — DX: Age-related nuclear cataract, left eye: H25.12

## 2020-07-06 MED ORDER — BEVACIZUMAB CHEMO INJECTION 1.25MG/0.05ML SYRINGE FOR KALEIDOSCOPE
1.2500 mg | INTRAVITREAL | Status: AC | PRN
  Administered 2020-07-06: 1.25 mg via INTRAVITREAL

## 2020-07-06 NOTE — Assessment & Plan Note (Addendum)
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 Treatment options were outlined including medical therapy, laser & vitrectomy. The use of injectable medications reviewed, including Avastin, Lucentis, and Eylea. Periodic injections into the eye are likely to resolve diabetic macular edema (swelling in the center of vision). Initially, injections are delivered are delivered every 4-6 weeks, and the interval extended as the condition improves. On average, 8-9 injections the first year, and 5 in year 2. Improvement in the condition most often improves on medical therapy. Occasional use of focal laser is also recommended for residual macular edema (swelling). Excellent control of blood glucose and blood pressure are encouraged under the care of a primary physician or endocrinologist. Similarly, attempts to maintain serum cholesterol, low density lipoproteins, and high-density lipoproteins in a favorable range were recommended.    Okay to proceed with cataract extraction with intraocular lens placement left eye at any time, we will continue to monitor the retinopathy

## 2020-07-06 NOTE — Patient Instructions (Signed)
Patient instructed to contact the office for new onset visual acuity declines or distortions

## 2020-07-06 NOTE — Assessment & Plan Note (Signed)
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  Not related to  recent cataract extraction.  In fact now more visible due to cataract and opacity removal OD  We will need to recommence and restart injection intravitreal Avastin OD today.

## 2020-07-06 NOTE — Assessment & Plan Note (Signed)
No signs of active CSME OS at this point.  May proceed with cataract surgery in the left eye at any time
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
## 2020-07-07 IMAGING — CT CT HEAD W/O CM
4 series · 17 of 47 positions shown, 19 images · non-contrast
Comparison: 01/27/2019

CLINICAL DATA: High glucose. Shaking. confusion

EXAM:
CT HEAD WITHOUT CONTRAST
TECHNIQUE: Contiguous axial images were obtained from the base of the skull
through the vertex without intravenous contrast.

[Series 3: head bone · axial · 0.40mm/px · z∈[+1130,+1186]mm · 4 of 82 slices shown]
[im 9/82  bone]
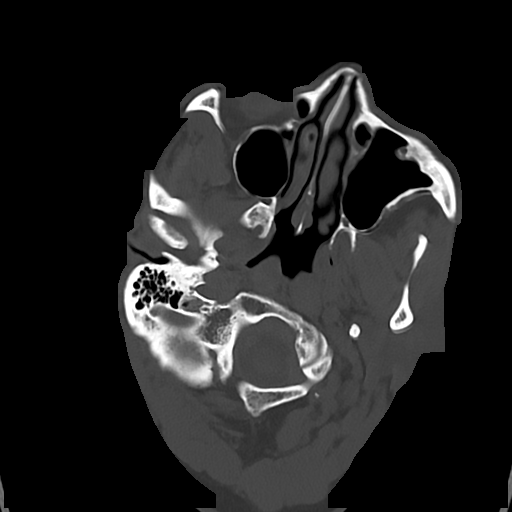
[im 17/82  bone]
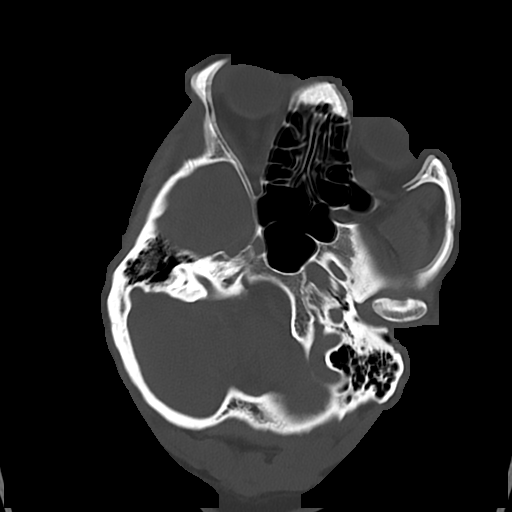
[im 25/82  bone]
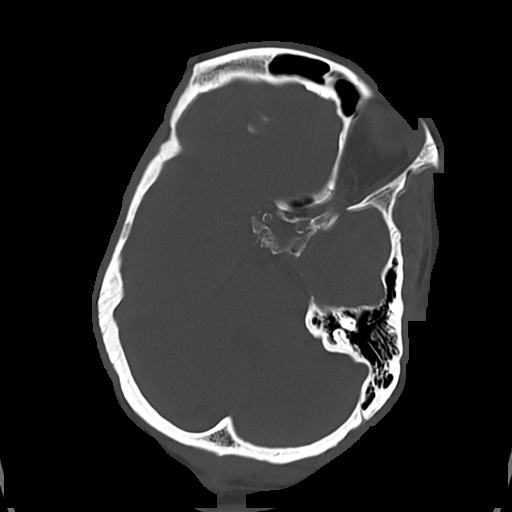
[im 37/82  bone]
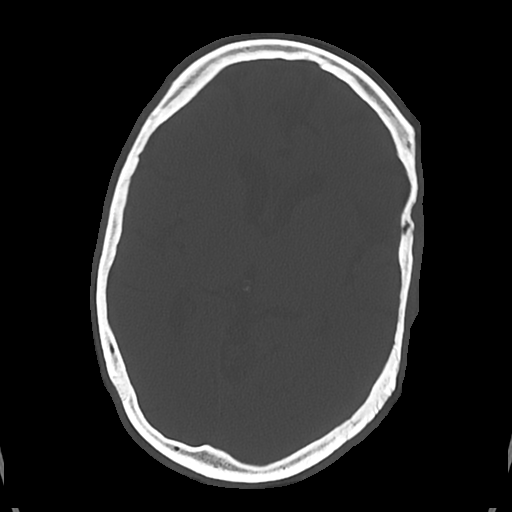

[Series 4: head wo · axial · 0.40mm/px · z∈[+1134,+1254]mm · 7 of 33 slices shown, 9 images]
[im 5/33  brain]
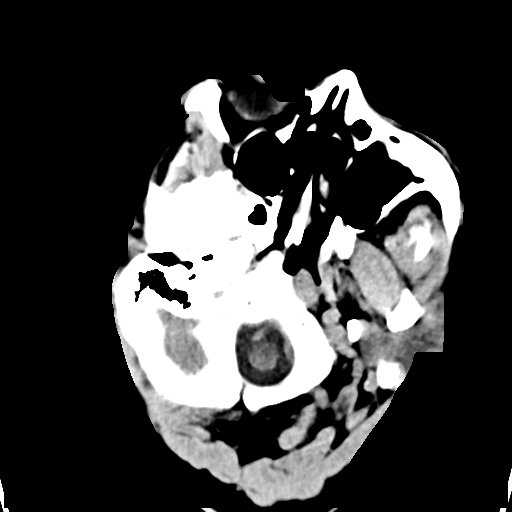
[im 5/33  bone]
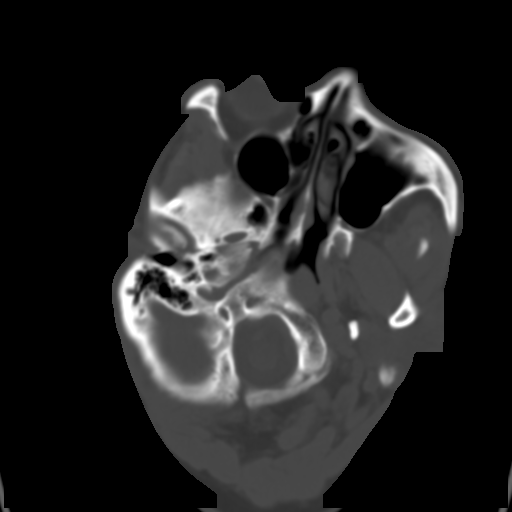
[im 9/33  brain]
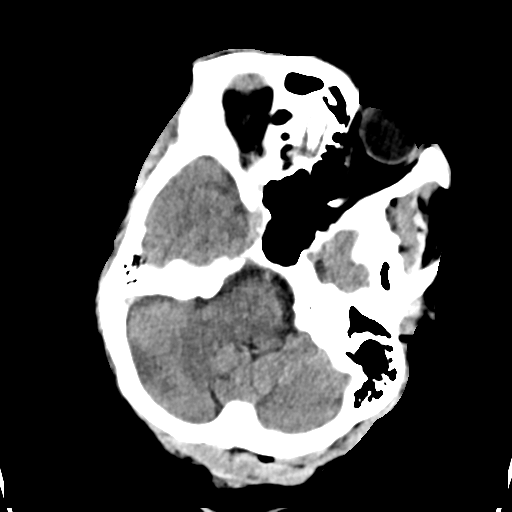
[im 13/33  brain]
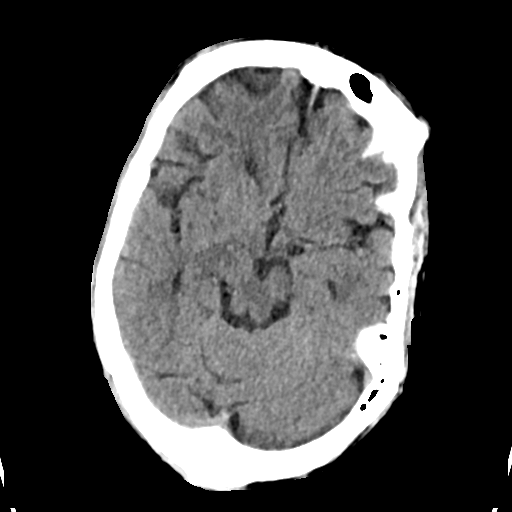
[im 17/33  brain]
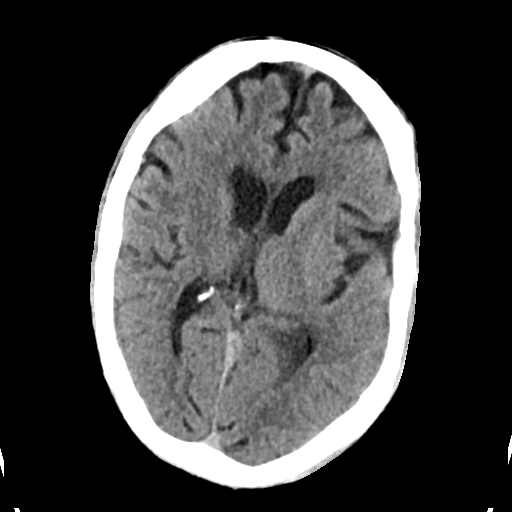
[im 21/33  brain]
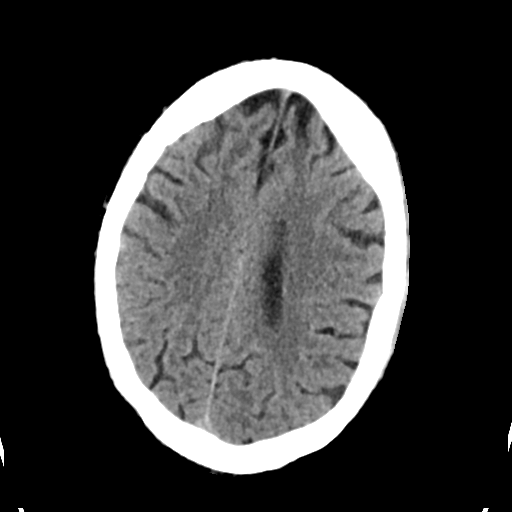
[im 21/33  bone]
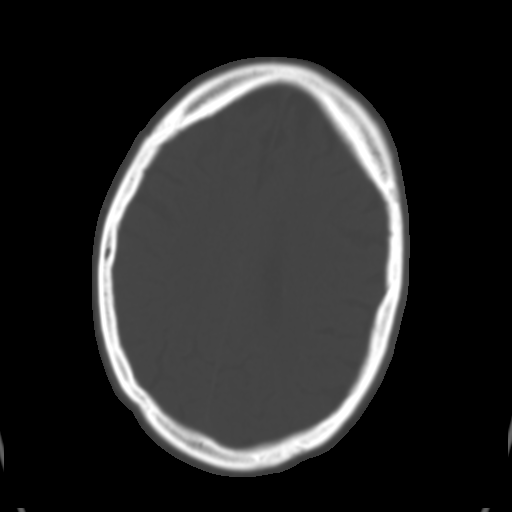
[im 25/33  brain]
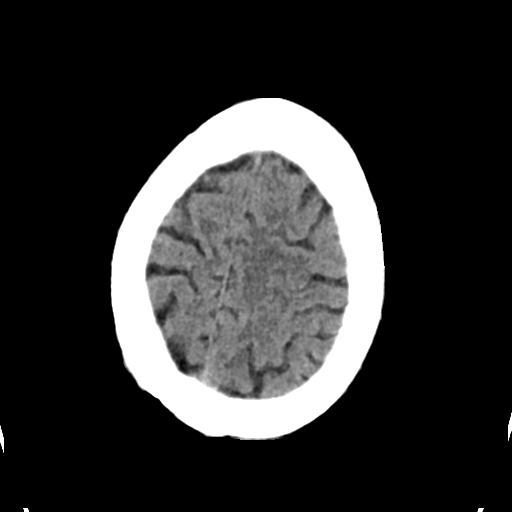
[im 29/33  brain]
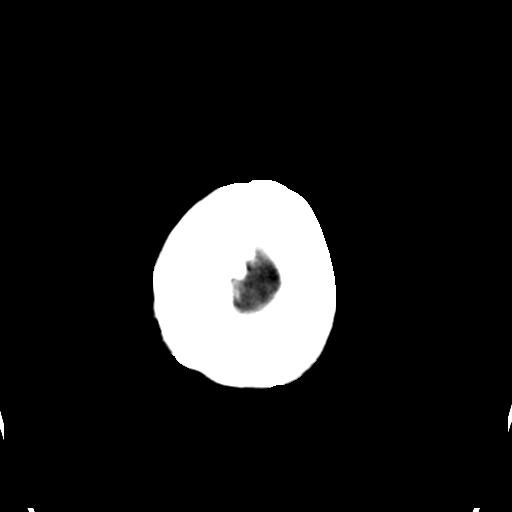

[Series 5: cor soft · coronal · 0.33mm/px · 3 of 69 slices shown]
[im 23/69  brain]
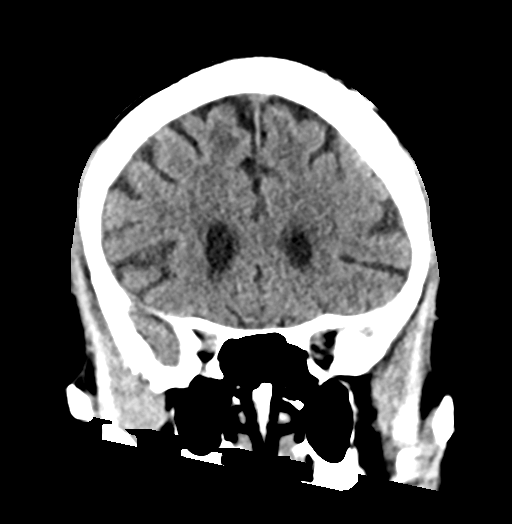
[im 31/69  brain]
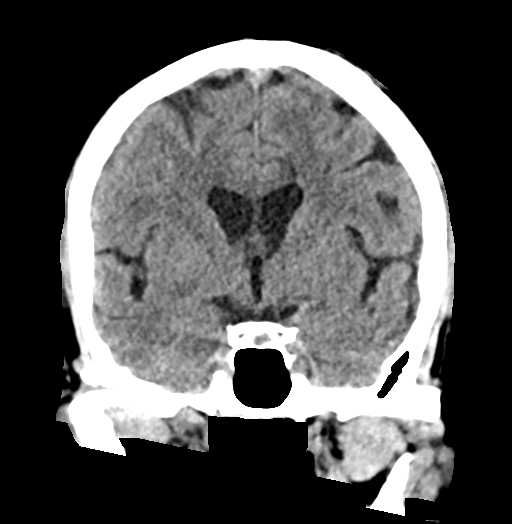
[im 38/69  brain]
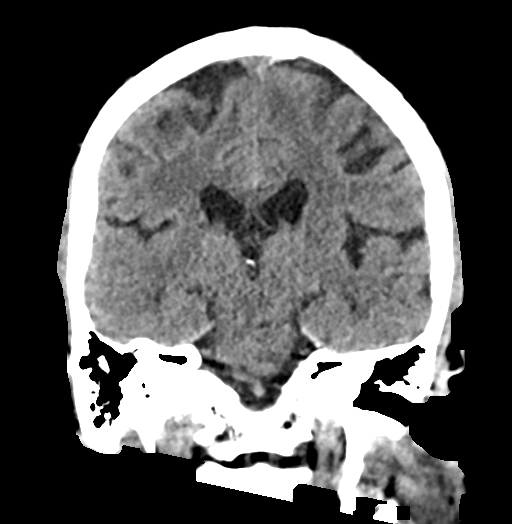

[Series 6: sag soft · sagittal · 0.34mm/px · 3 of 56 slices shown]
[im 22/56  brain]
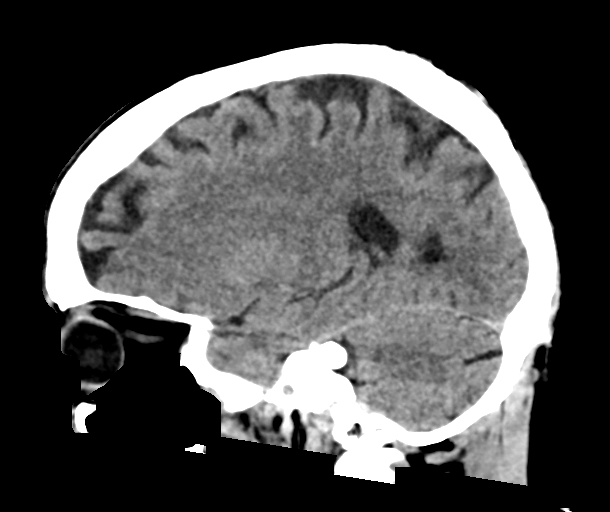
[im 28/56  brain]
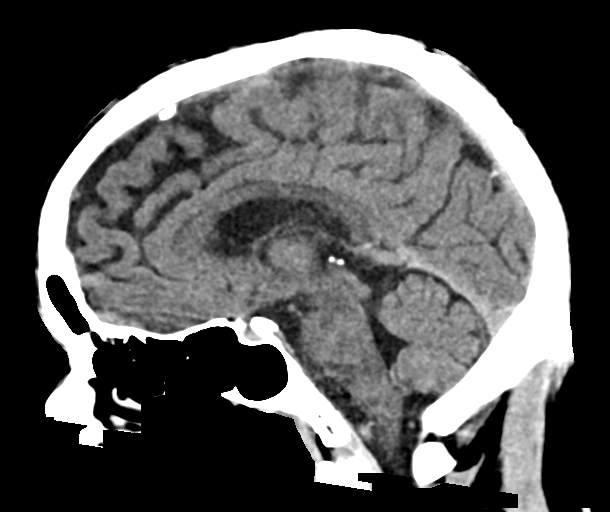
[im 34/56  brain]
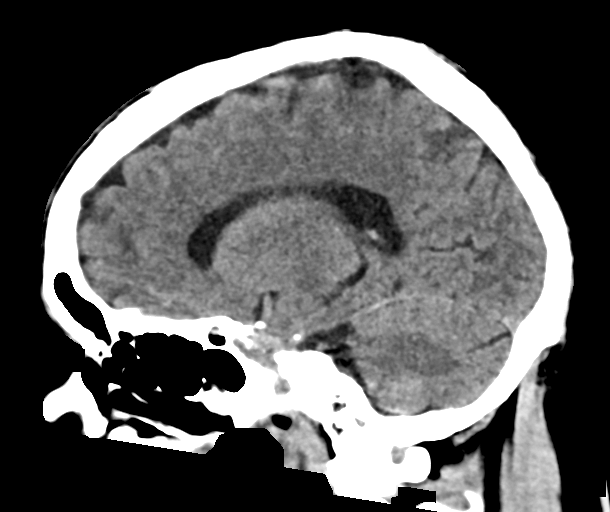

[17 of 47 positions shown; findings below may reference images not displayed]

FINDINGS: Brain: Study quality is degraded by patient motion artifact.There
is no intra or extra-axial fluid collection or mass lesion. The
basilar cisterns and ventricles have a normal appearance. There is
no CT evidence for acute infarction or hemorrhage.

Vascular: There is significant atherosclerotic calcification of the
internal carotid arteries. A hyperdense vessels.

Skull: Normal. Negative for fracture or focal lesion.

Sinuses/Orbits: No acute finding.

Other: None.
IMPRESSION: 1. No evidence for acute intracranial abnormality.
2. Significant atherosclerotic calcification of the internal carotid
arteries.

## 2020-07-18 ENCOUNTER — Other Ambulatory Visit: Payer: Medicare HMO

## 2020-07-19 ENCOUNTER — Other Ambulatory Visit: Payer: Medicare HMO

## 2020-07-24 DIAGNOSIS — H2512 Age-related nuclear cataract, left eye: Secondary | ICD-10-CM | POA: Diagnosis not present

## 2020-07-26 ENCOUNTER — Other Ambulatory Visit: Payer: Self-pay | Admitting: Internal Medicine

## 2020-07-26 DIAGNOSIS — D649 Anemia, unspecified: Secondary | ICD-10-CM | POA: Diagnosis not present

## 2020-07-26 DIAGNOSIS — R972 Elevated prostate specific antigen [PSA]: Secondary | ICD-10-CM | POA: Diagnosis not present

## 2020-07-27 LAB — IRON AND TIBC
Iron Saturation: 14 % — ABNORMAL LOW (ref 15–55)
Iron: 46 ug/dL (ref 38–169)
Total Iron Binding Capacity: 319 ug/dL (ref 250–450)
UIBC: 273 ug/dL (ref 111–343)

## 2020-07-27 LAB — FOLATE RBC
Folate, Hemolysate: 257 ng/mL
Folate, RBC: 756 ng/mL (ref >498)
Hematocrit: 34 % — ABNORMAL LOW (ref 37.5–51.0)

## 2020-07-27 LAB — PSA, TOTAL AND FREE
PSA, Free Pct: 8.8 %
PSA, Free: 0.67 ng/mL
Prostate Specific Ag, Serum: 7.6 ng/mL — ABNORMAL HIGH (ref 0.0–4.0)

## 2020-07-27 LAB — VITAMIN B12: Vitamin B-12: 770 pg/mL (ref 232–1245)

## 2020-08-10 ENCOUNTER — Encounter (INDEPENDENT_AMBULATORY_CARE_PROVIDER_SITE_OTHER): Payer: Medicare HMO | Admitting: Ophthalmology

## 2020-08-17 ENCOUNTER — Encounter (INDEPENDENT_AMBULATORY_CARE_PROVIDER_SITE_OTHER): Payer: Medicare HMO | Admitting: Ophthalmology

## 2020-08-17 ENCOUNTER — Encounter (INDEPENDENT_AMBULATORY_CARE_PROVIDER_SITE_OTHER): Payer: Self-pay

## 2020-08-18 DIAGNOSIS — H25812 Combined forms of age-related cataract, left eye: Secondary | ICD-10-CM | POA: Diagnosis not present

## 2020-08-18 DIAGNOSIS — H268 Other specified cataract: Secondary | ICD-10-CM | POA: Diagnosis not present

## 2020-09-02 ENCOUNTER — Ambulatory Visit (INDEPENDENT_AMBULATORY_CARE_PROVIDER_SITE_OTHER): Payer: Medicare HMO | Admitting: Internal Medicine

## 2020-09-02 ENCOUNTER — Encounter: Payer: Self-pay | Admitting: Internal Medicine

## 2020-09-02 ENCOUNTER — Other Ambulatory Visit: Payer: Self-pay

## 2020-09-02 VITALS — BP 135/63 | HR 81 | Resp 12 | Ht 64.5 in | Wt 151.0 lb

## 2020-09-02 DIAGNOSIS — B351 Tinea unguium: Secondary | ICD-10-CM

## 2020-09-02 DIAGNOSIS — L84 Corns and callosities: Secondary | ICD-10-CM | POA: Insufficient documentation

## 2020-09-02 DIAGNOSIS — E7800 Pure hypercholesterolemia, unspecified: Secondary | ICD-10-CM | POA: Diagnosis not present

## 2020-09-02 DIAGNOSIS — R972 Elevated prostate specific antigen [PSA]: Secondary | ICD-10-CM

## 2020-09-02 DIAGNOSIS — B353 Tinea pedis: Secondary | ICD-10-CM

## 2020-09-02 DIAGNOSIS — H2512 Age-related nuclear cataract, left eye: Secondary | ICD-10-CM

## 2020-09-02 DIAGNOSIS — E113411 Type 2 diabetes mellitus with severe nonproliferative diabetic retinopathy with macular edema, right eye: Secondary | ICD-10-CM | POA: Diagnosis not present

## 2020-09-02 DIAGNOSIS — E1136 Type 2 diabetes mellitus with diabetic cataract: Secondary | ICD-10-CM | POA: Diagnosis not present

## 2020-09-02 DIAGNOSIS — E113492 Type 2 diabetes mellitus with severe nonproliferative diabetic retinopathy without macular edema, left eye: Secondary | ICD-10-CM

## 2020-09-02 DIAGNOSIS — Z23 Encounter for immunization: Secondary | ICD-10-CM | POA: Diagnosis not present

## 2020-09-02 DIAGNOSIS — D509 Iron deficiency anemia, unspecified: Secondary | ICD-10-CM | POA: Diagnosis not present

## 2020-09-02 DIAGNOSIS — I10 Essential (primary) hypertension: Secondary | ICD-10-CM

## 2020-09-02 HISTORY — DX: Type 2 diabetes mellitus with diabetic cataract: E11.36

## 2020-09-02 MED ORDER — TERBINAFINE HCL 1 % EX CREA: TOPICAL_CREAM | CUTANEOUS | 0 refills | Status: AC

## 2020-09-02 MED ORDER — TEA TREE OIL EX OIL: TOPICAL_OIL | CUTANEOUS | Status: AC

## 2020-09-02 NOTE — Patient Instructions (Signed)
Get your influenza vaccine at [facility] or wherever you prefer--take your Medicare card to show them.

## 2020-09-02 NOTE — Progress Notes (Signed)
Subjective:    Patient ID: Brandon Sullivan, male   DOB: 1951-01-23, 69 y.o.   MRN: [id number]   HPI   Sister, [name], is working today.  Patient gives his okay to notify her of what we discuss today.  1.  Diabetic retinopathy with cataract of left eye:  Treatment plan for injections and likely laser therapy discussed.  Patient was to follow up with Dr. Zadie Rhine on 11/17, but not clear he did by his history today and unable to ascertain in record.  2.  Elevated PSA with free PSA in follow up labs in concerning range for prostate cancer.  Previously discussed likelihood of need for Urology referral.   No definitive urinary symptoms.   3.  Iron deficiency anemia with follow up labs end of last month.  Will need colonoscopy and possibly EGD.  States he had a colonoscopy 20 years ago that was normal from his recollection. No definite digestive symptoms or abdominal pain. No melena or hematochezia.  4.  DM:  A1C in September was quite good at 5.8%.  Continues with Metformin 1000 mg twice daily.  May miss his dosing in the afternoon.  States does not miss often  5.  Elevated LDL:  Taking Atorvastatin since LDL returned a bit high in September.  States tolerating well.  He is nonfasting this morning.  6. Hypertension:  controlled   7.  HM:  No flu vaccine, did get COvID booster here.  Need pneumo23.    Current Meds  Medication Sig  . aspirin EC 81 MG tablet Take 1 tablet (81 mg total) by mouth daily. Swallow whole.  Marland Kitchen atorvastatin (LIPITOR) 20 MG tablet Take 1 tablet (20 mg total) by mouth daily.  . blood glucose meter kit and supplies Dispense based on patient and insurance preference. Use up to four times daily as directed. (FOR ICD-10 E10.9, E11.9).  Marland Kitchen glipiZIDE (GLUCOTROL) 10 MG tablet 1 tab by mouth twice daily with meals  . losartan (COZAAR) 50 MG tablet 1 tab by mouth daily in the morning with meal  . metFORMIN (GLUCOPHAGE) 1000 MG tablet Take 1 tablet (1,000 mg total) by mouth 2 (two)  times daily with a meal.  . metoprolol tartrate (LOPRESSOR) 25 MG tablet Take 1 tablet (25 mg total) by mouth 2 (two) times daily.  Glory Rosebush Delica Lancets 40J MISC USE AS DIRECTED   No Known Allergies   Review of Systems    Objective:   BP 135/63 (BP Location: Right Arm, Patient Position: Sitting, Cuff Size: Normal)   Pulse 81   Resp 12   Ht 5' 4.5" (1.638 m)   Wt 151 lb (68.5 kg)   BMI 25.52 kg/m   Physical Exam  NAD Chest:  CTA CV:  RRR without murmur or rub.  Radial and DP pulses normal and equal. Abd:  S, NT, No HSM or mass. + BS Rectal:  Anal sphincter with mildly decreased tone.  No rectal mass, heme negative light brown stool.  Prostate NT, smooth, and without obvious hypertrophy in lower 3/4.  Could not reach upper pole adequately LE:  No edema  Diabetic foot exam was performed with the following findings:   Intact posterior tibialis and dorsalis pedis pulses Feet with diffuse superficial flaking and thickened, discolored and crumbling nails.  Some mild callusing in areas. 10g monofilament sensation testing was inconsistent, though likely felt in all 10 locations, just decreased.       Assessment & Plan  1.  DM:  Has been well controlled.  A1C as difficult to get history from patient as to his glucose control with home testing.  2.  Hypertension:  Controlled.  3.  Elevated LDL:  Atorvastatin.  Nonfasting lipid profile today  4.  Mildly elevated PSA of 7.6 with Free PSA of 8.8% and probability of prostate cancer estimated at 55%.  Referral to Urology  5.  Iron deficiency anemia without GI symptoms and negative guaiac today.  Referral to GI for colonoscopy and possible EGD.  6.  HM:  Encouraged to get influenza at pharmacy as do not have today.    7.  Tinea pedis/onychomycosis/callus formation and possibly mild DM peripheral neuropathy and need for nail care and home daily foot care:  Terbinafine cream twice daily to entire foot, not in between toes.   Consider Tea Tree oil mixed with [place] Foot cream after bathing daily as well, also not between toes. Referral to podiatry.    8.  Diabetic left cataract and retinopathy with macular edema:  Will have [name] check with Dr. Zadie Rhine and Dr. Zenia Resides offices for follow up of both.  9.  HM:  Pneumococcal 23 v today and recommend getting influenza at pharmacy  Discuss all with [facility].  Need to see if they have looked into any home health care.

## 2020-09-03 LAB — HGB A1C W/O EAG: Hgb A1c MFr Bld: 5.8 % — ABNORMAL HIGH (ref 4.8–5.6)

## 2020-09-03 LAB — LIPID PANEL W/O CHOL/HDL RATIO
Cholesterol, Total: 112 mg/dL (ref 100–199)
HDL: 54 mg/dL (ref >39)
LDL Chol Calc (NIH): 37 mg/dL (ref 0–99)
Triglycerides: 114 mg/dL (ref 0–149)
VLDL Cholesterol Cal: 21 mg/dL (ref 5–40)

## 2020-09-22 ENCOUNTER — Ambulatory Visit: Payer: Medicare HMO | Admitting: Podiatry

## 2020-11-30 ENCOUNTER — Encounter (INDEPENDENT_AMBULATORY_CARE_PROVIDER_SITE_OTHER): Payer: Self-pay

## 2020-12-19 ENCOUNTER — Telehealth: Payer: Self-pay | Admitting: Internal Medicine

## 2020-12-21 NOTE — Telephone Encounter (Signed)
Called patient; no answer and unable to left message.

## 2021-01-02 ENCOUNTER — Telehealth: Payer: Self-pay | Admitting: Internal Medicine

## 2021-01-02 NOTE — Telephone Encounter (Signed)
Spoke with patient's sister, [name], she confirmed patient's appointment for 01/04/2021 and requested urology and podiatry phone numbers to reschedule patient's missing appointments.

## 2021-01-04 ENCOUNTER — Ambulatory Visit: Payer: Medicare HMO | Admitting: Internal Medicine

## 2021-01-06 ENCOUNTER — Ambulatory Visit: Payer: Medicare (Managed Care) | Admitting: Internal Medicine

## 2021-01-16 ENCOUNTER — Telehealth: Payer: Self-pay | Admitting: Internal Medicine

## 2021-01-16 NOTE — Telephone Encounter (Signed)
Patient's sister, [name], called requesting a new appointment for Podiatry since that one you previously sent was for a Doctor outside of patient's network. If we can let Ms. [name] know when new referral is in to schedule appointment.

## 2021-01-17 NOTE — Telephone Encounter (Signed)
There is no notice from the podiatry office on the referral that the patient's insurance does not cover.  Can Brandon Sullivan check with his insurance to see who is covered?

## 2021-01-19 NOTE — Telephone Encounter (Signed)
Spoke with Ms. Brandon Sullivan and explained to her that there's no way for Dr. Delrae Alfred to know what Doctors are in the insurance network and which ones are not. Ms. Brandon Sullivan agreed to call the insurance to find out what podiatric offices are in the network and will call back to let Dr. Delrae Alfred know so she can send a new referral.

## 2021-02-08 ENCOUNTER — Encounter (INDEPENDENT_AMBULATORY_CARE_PROVIDER_SITE_OTHER): Payer: Self-pay

## 2021-02-08 ENCOUNTER — Encounter (INDEPENDENT_AMBULATORY_CARE_PROVIDER_SITE_OTHER): Payer: Medicare (Managed Care) | Admitting: Ophthalmology

## 2021-03-01 ENCOUNTER — Other Ambulatory Visit: Payer: Self-pay

## 2021-03-01 ENCOUNTER — Ambulatory Visit (INDEPENDENT_AMBULATORY_CARE_PROVIDER_SITE_OTHER): Payer: Medicare HMO | Admitting: Internal Medicine

## 2021-03-01 ENCOUNTER — Encounter: Payer: Self-pay | Admitting: Internal Medicine

## 2021-03-01 VITALS — BP 150/80 | HR 100 | Resp 24 | Ht 64.5 in | Wt 156.5 lb

## 2021-03-01 DIAGNOSIS — R972 Elevated prostate specific antigen [PSA]: Secondary | ICD-10-CM

## 2021-03-01 DIAGNOSIS — Z23 Encounter for immunization: Secondary | ICD-10-CM

## 2021-03-01 DIAGNOSIS — N189 Chronic kidney disease, unspecified: Secondary | ICD-10-CM

## 2021-03-01 DIAGNOSIS — D509 Iron deficiency anemia, unspecified: Secondary | ICD-10-CM | POA: Diagnosis not present

## 2021-03-01 DIAGNOSIS — I10 Essential (primary) hypertension: Secondary | ICD-10-CM

## 2021-03-01 DIAGNOSIS — E1136 Type 2 diabetes mellitus with diabetic cataract: Secondary | ICD-10-CM | POA: Diagnosis not present

## 2021-03-01 DIAGNOSIS — E1142 Type 2 diabetes mellitus with diabetic polyneuropathy: Secondary | ICD-10-CM

## 2021-03-01 MED ORDER — GABAPENTIN 100 MG PO CAPS: 100.0000 mg | ORAL_CAPSULE | Freq: Every day | ORAL | 3 refills | Status: DC
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
## 2021-03-02 LAB — CBC WITH DIFFERENTIAL/PLATELET
Basophils Absolute: 0 10*3/uL (ref 0.0–0.2)
Basos: 1 %
EOS (ABSOLUTE): 0.1 10*3/uL (ref 0.0–0.4)
Eos: 2 %
Hematocrit: 35.9 % — ABNORMAL LOW (ref 37.5–51.0)
Hemoglobin: 11.8 g/dL — ABNORMAL LOW (ref 13.0–17.7)
Immature Grans (Abs): 0 10*3/uL (ref 0.0–0.1)
Immature Granulocytes: 0 %
Lymphocytes Absolute: 1.5 10*3/uL (ref 0.7–3.1)
Lymphs: 26 %
MCH: 26.9 pg (ref 26.6–33.0)
MCHC: 32.9 g/dL (ref 31.5–35.7)
MCV: 82 fL (ref 79–97)
Monocytes Absolute: 0.5 10*3/uL (ref 0.1–0.9)
Monocytes: 8 %
Neutrophils Absolute: 3.6 10*3/uL (ref 1.4–7.0)
Neutrophils: 63 %
Platelets: 189 10*3/uL (ref 150–450)
RBC: 4.38 x10E6/uL (ref 4.14–5.80)
RDW: 14.9 % (ref 11.6–15.4)
WBC: 5.6 10*3/uL (ref 3.4–10.8)

## 2021-03-02 LAB — COMPREHENSIVE METABOLIC PANEL
ALT: 13 IU/L (ref 0–44)
AST: 14 IU/L (ref 0–40)
Albumin/Globulin Ratio: 1.4 (ref 1.2–2.2)
Albumin: 4.4 g/dL (ref 3.8–4.8)
Alkaline Phosphatase: 101 IU/L (ref 44–121)
BUN/Creatinine Ratio: 12 (ref 10–24)
BUN: 20 mg/dL (ref 8–27)
Bilirubin Total: 0.4 mg/dL (ref 0.0–1.2)
CO2: 18 mmol/L — ABNORMAL LOW (ref 20–29)
Calcium: 9.9 mg/dL (ref 8.6–10.2)
Chloride: 107 mmol/L — ABNORMAL HIGH (ref 96–106)
Creatinine, Ser: 1.69 mg/dL — ABNORMAL HIGH (ref 0.76–1.27)
Globulin, Total: 3.2 g/dL (ref 1.5–4.5)
Glucose: 146 mg/dL — ABNORMAL HIGH (ref 65–99)
Potassium: 4.4 mmol/L (ref 3.5–5.2)
Sodium: 141 mmol/L (ref 134–144)
Total Protein: 7.6 g/dL (ref 6.0–8.5)
eGFR: 43 mL/min/{1.73_m2} — ABNORMAL LOW (ref >59)

## 2021-03-02 LAB — PSA, TOTAL AND FREE
PSA, Free Pct: 8.8 %
PSA, Free: 0.63 ng/mL
Prostate Specific Ag, Serum: 7.2 ng/mL — ABNORMAL HIGH (ref 0.0–4.0)

## 2021-03-02 LAB — HGB A1C W/O EAG: Hgb A1c MFr Bld: 5.9 % — ABNORMAL HIGH (ref 4.8–5.6)

## 2021-03-13 ENCOUNTER — Telehealth: Payer: Self-pay | Admitting: Podiatry

## 2021-03-13 NOTE — Telephone Encounter (Signed)
I don't believe this patient is established at our practice. - Dr. Logan Bores

## 2021-03-13 NOTE — Telephone Encounter (Signed)
Flexi M. Called from [facility] for a prior auth for skin graft code:  E11.621 procedure [id number]  Please call back asap

## 2021-03-15 ENCOUNTER — Telehealth: Payer: Self-pay | Admitting: Internal Medicine

## 2021-03-15 NOTE — Telephone Encounter (Signed)
[name] will bring in the lab request for me to look at  Gave her phone numbers for [facility] and [facility] to call and get his referrals set up. Shared with her his continued anemia and mildly elevated PSA  Will call her with recommendations for other labs

## 2021-03-17 ENCOUNTER — Encounter: Payer: Self-pay | Admitting: Gastroenterology

## 2021-03-23 ENCOUNTER — Encounter (INDEPENDENT_AMBULATORY_CARE_PROVIDER_SITE_OTHER): Payer: Medicare HMO | Admitting: Ophthalmology

## 2021-04-10 ENCOUNTER — Encounter: Payer: Self-pay | Admitting: Podiatry

## 2021-04-10 ENCOUNTER — Other Ambulatory Visit: Payer: Self-pay

## 2021-04-10 ENCOUNTER — Ambulatory Visit: Payer: Medicare HMO | Admitting: Podiatry

## 2021-04-10 DIAGNOSIS — B351 Tinea unguium: Secondary | ICD-10-CM | POA: Diagnosis not present

## 2021-04-10 DIAGNOSIS — E1142 Type 2 diabetes mellitus with diabetic polyneuropathy: Secondary | ICD-10-CM

## 2021-04-10 DIAGNOSIS — M79675 Pain in left toe(s): Secondary | ICD-10-CM | POA: Diagnosis not present

## 2021-04-10 DIAGNOSIS — E1140 Type 2 diabetes mellitus with diabetic neuropathy, unspecified: Secondary | ICD-10-CM | POA: Insufficient documentation

## 2021-04-10 DIAGNOSIS — M79674 Pain in right toe(s): Secondary | ICD-10-CM | POA: Insufficient documentation

## 2021-04-10 NOTE — Progress Notes (Signed)
This patient returns to my office for at risk foot care.  This patient requires this care by a professional since this patient will be at risk due to having diabetes.  This patient is unable to cut nails himself since the patient cannot reach his nails.These nails are painful walking and wearing shoes.  This patient presents for at risk foot care today.  General Appearance  Alert, conversant and in no acute stress.  Vascular  Dorsalis pedis and posterior tibial  pulses are palpable  bilaterally.  Capillary return is within normal limits  bilaterally. Temperature is within normal limits  bilaterally.  Neurologic  Senn-Weinstein monofilament wire test  right diminished.  LOPS left  WNL.Marland Kitchen Muscle power within normal limits bilaterally.  Nails Thick disfigured discolored nails with subungual debris  from hallux to fifth toes bilaterally. No evidence of bacterial infection or drainage bilaterally.  Orthopedic  No limitations of motion  feet .  No crepitus or effusions noted.  No bony pathology .  Hammer toes second left foot.  Skin  normotropic skin with no porokeratosis noted bilaterally.  No signs of infections or ulcers noted.     Onychomycosis  Pain in right toes  Pain in left toes  Consent was obtained for treatment procedures.   Mechanical debridement of nails 1-5  bilaterally performed with a nail nipper.  Filed with dremel without incident.    Return office visit   4 months                   Told patient to return for periodic foot care and evaluation due to potential at risk complications.   Helane Gunther DPM

## 2021-04-24 ENCOUNTER — Ambulatory Visit: Payer: Medicare HMO | Admitting: Gastroenterology

## 2021-05-01 ENCOUNTER — Ambulatory Visit (INDEPENDENT_AMBULATORY_CARE_PROVIDER_SITE_OTHER): Payer: Medicare HMO | Admitting: Ophthalmology

## 2021-05-01 ENCOUNTER — Other Ambulatory Visit: Payer: Self-pay

## 2021-05-01 ENCOUNTER — Encounter (INDEPENDENT_AMBULATORY_CARE_PROVIDER_SITE_OTHER): Payer: Medicare HMO | Admitting: Ophthalmology

## 2021-05-01 ENCOUNTER — Encounter (INDEPENDENT_AMBULATORY_CARE_PROVIDER_SITE_OTHER): Payer: Self-pay | Admitting: Ophthalmology

## 2021-05-01 DIAGNOSIS — E113492 Type 2 diabetes mellitus with severe nonproliferative diabetic retinopathy without macular edema, left eye: Secondary | ICD-10-CM | POA: Diagnosis not present

## 2021-05-01 DIAGNOSIS — E113411 Type 2 diabetes mellitus with severe nonproliferative diabetic retinopathy with macular edema, right eye: Secondary | ICD-10-CM | POA: Diagnosis not present

## 2021-05-01 DIAGNOSIS — H43821 Vitreomacular adhesion, right eye: Secondary | ICD-10-CM | POA: Diagnosis not present

## 2021-05-01 MED ORDER — BEVACIZUMAB 2.5 MG/0.1ML IZ SOSY
2.5000 mg | PREFILLED_SYRINGE | INTRAVITREAL | Status: AC | PRN
  Administered 2021-05-01: 2.5 mg via INTRAVITREAL
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
## 2021-05-01 NOTE — Assessment & Plan Note (Signed)
No signs of CME or CSME OS

## 2021-05-01 NOTE — Assessment & Plan Note (Signed)
Some component of vitreomacular adhesion may be playing a role focally in the temporal CSME in this right eye.  Follow-up in 5 to 6 weeks we will have some idea as to how much residual traction remains of CSME improves post injection Avastin today

## 2021-05-01 NOTE — Assessment & Plan Note (Signed)
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 Treatment options were outlined including medical therapy, laser & vitrectomy. The use of injectable medications reviewed, including Avastin, Lucentis, and Eylea. Periodic injections into the eye are likely to resolve diabetic macular edema (swelling in the center of vision). Initially, injections are delivered are delivered every 4-6 weeks, and the interval extended as the condition improves. On average, 8-9 injections the first year, and 5 in year 2. Improvement in the condition most often improves on medical therapy. Occasional use of focal laser is also recommended for residual macular edema (swelling). Excellent control of blood glucose and blood pressure are encouraged under the care of a primary physician or endocrinologist. Similarly, attempts to maintain serum cholesterol, low density lipoproteins, and high-density lipoproteins in a favorable range were recommended.  

## 2021-05-11 ENCOUNTER — Telehealth: Payer: Self-pay | Admitting: Internal Medicine

## 2021-05-11 NOTE — Telephone Encounter (Signed)
Patient's sister [name] called on behalf of patient requesting Rx for blood glucose meter kit and supplies. States the one patient had broke.  [facility].

## 2021-05-13 ENCOUNTER — Emergency Department (HOSPITAL_COMMUNITY)
Admission: EM | Admit: 2021-05-13 | Discharge: 2021-05-13 | Disposition: A | Discharge status: Home / Self Care | Payer: Medicare HMO | Attending: Emergency Medicine | Admitting: Emergency Medicine

## 2021-05-13 ENCOUNTER — Emergency Department (HOSPITAL_COMMUNITY): Payer: Medicare HMO

## 2021-05-13 ENCOUNTER — Other Ambulatory Visit: Payer: Self-pay

## 2021-05-13 DIAGNOSIS — E11319 Type 2 diabetes mellitus with unspecified diabetic retinopathy without macular edema: Secondary | ICD-10-CM | POA: Insufficient documentation

## 2021-05-13 DIAGNOSIS — I10 Essential (primary) hypertension: Secondary | ICD-10-CM | POA: Insufficient documentation

## 2021-05-13 DIAGNOSIS — Z7984 Long term (current) use of oral hypoglycemic drugs: Secondary | ICD-10-CM | POA: Diagnosis not present

## 2021-05-13 DIAGNOSIS — Z79899 Other long term (current) drug therapy: Secondary | ICD-10-CM | POA: Insufficient documentation

## 2021-05-13 DIAGNOSIS — Z7982 Long term (current) use of aspirin: Secondary | ICD-10-CM | POA: Diagnosis not present

## 2021-05-13 DIAGNOSIS — E11649 Type 2 diabetes mellitus with hypoglycemia without coma: Secondary | ICD-10-CM | POA: Insufficient documentation

## 2021-05-13 DIAGNOSIS — E1140 Type 2 diabetes mellitus with diabetic neuropathy, unspecified: Secondary | ICD-10-CM | POA: Diagnosis not present

## 2021-05-13 DIAGNOSIS — R0602 Shortness of breath: Secondary | ICD-10-CM | POA: Diagnosis not present

## 2021-05-13 DIAGNOSIS — E162 Hypoglycemia, unspecified: Secondary | ICD-10-CM | POA: Diagnosis present

## 2021-05-13 LAB — COMPREHENSIVE METABOLIC PANEL
ALT: 20 U/L (ref 0–44)
AST: 48 U/L — ABNORMAL HIGH (ref 15–41)
Albumin: 3.7 g/dL (ref 3.5–5.0)
Alkaline Phosphatase: 79 U/L (ref 38–126)
Anion gap: 10 (ref 5–15)
BUN: 12 mg/dL (ref 8–23)
CO2: 21 mmol/L — ABNORMAL LOW (ref 22–32)
Calcium: 8.3 mg/dL — ABNORMAL LOW (ref 8.9–10.3)
Chloride: 109 mmol/L (ref 98–111)
Creatinine, Ser: 1.42 mg/dL — ABNORMAL HIGH (ref 0.61–1.24)
GFR, Estimated: 53 mL/min — ABNORMAL LOW (ref >60)
Glucose, Bld: 166 mg/dL — ABNORMAL HIGH (ref 70–99)
Potassium: 3.3 mmol/L — ABNORMAL LOW (ref 3.5–5.1)
Sodium: 140 mmol/L (ref 135–145)
Total Bilirubin: 0.8 mg/dL (ref 0.3–1.2)
Total Protein: 6.9 g/dL (ref 6.5–8.1)

## 2021-05-13 LAB — CBC WITH DIFFERENTIAL/PLATELET
Abs Immature Granulocytes: 0.02 10*3/uL (ref 0.00–0.07)
Basophils Absolute: 0 10*3/uL (ref 0.0–0.1)
Basophils Relative: 0 %
Eosinophils Absolute: 0 10*3/uL (ref 0.0–0.5)
Eosinophils Relative: 1 %
HCT: 41.6 % (ref 39.0–52.0)
Hemoglobin: 13.7 g/dL (ref 13.0–17.0)
Immature Granulocytes: 0 %
Lymphocytes Relative: 23 %
Lymphs Abs: 1.3 10*3/uL (ref 0.7–4.0)
MCH: 27.4 pg (ref 26.0–34.0)
MCHC: 32.9 g/dL (ref 30.0–36.0)
MCV: 83.2 fL (ref 80.0–100.0)
Monocytes Absolute: 0.3 10*3/uL (ref 0.1–1.0)
Monocytes Relative: 5 %
Neutro Abs: 3.8 10*3/uL (ref 1.7–7.7)
Neutrophils Relative %: 71 %
Platelets: 193 10*3/uL (ref 150–400)
RBC: 5 MIL/uL (ref 4.22–5.81)
RDW: 14.9 % (ref 11.5–15.5)
WBC: 5.5 10*3/uL (ref 4.0–10.5)
nRBC: 0 % (ref 0.0–0.2)

## 2021-05-13 LAB — BRAIN NATRIURETIC PEPTIDE: B Natriuretic Peptide: 71 pg/mL (ref 0.0–100.0)

## 2021-05-13 LAB — TROPONIN I (HIGH SENSITIVITY)
Troponin I (High Sensitivity): 10 ng/L (ref <18)
Troponin I (High Sensitivity): 11 ng/L (ref <18)

## 2021-05-13 LAB — CBG MONITORING, ED
Glucose-Capillary: 90 mg/dL (ref 70–99)
Glucose-Capillary: 98 mg/dL (ref 70–99)
Glucose-Capillary: 133 mg/dL — ABNORMAL HIGH (ref 70–99)
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
## 2021-05-13 NOTE — ED Triage Notes (Signed)
Pt presents to ED via ems cc hypoglycemia. EMS states pt was found unresponsive in his bed by neighbors today. EmS states pt's BGL was 22. EMS states administering D10 IV enroute to ED. Pt A&ox4 in ED. Respirations equal, unlabored.

## 2021-05-13 NOTE — Discharge Instructions (Addendum)
Please eat and drink

## 2021-05-27 ENCOUNTER — Ambulatory Visit (INDEPENDENT_AMBULATORY_CARE_PROVIDER_SITE_OTHER): Payer: Medicare HMO

## 2021-05-27 ENCOUNTER — Encounter (INDEPENDENT_AMBULATORY_CARE_PROVIDER_SITE_OTHER): Payer: Self-pay

## 2021-05-27 DIAGNOSIS — Z0000 Encounter for general adult medical examination without abnormal findings: Secondary | ICD-10-CM | POA: Diagnosis not present

## 2021-05-27 NOTE — Progress Notes (Addendum)
Subjective:   [place] is a 70 y.o. male who presents for an Initial Medicare Annual Wellness Visit.  I connected with  [place] on 05/27/21 by a audio enabled telemedicine application and verified that I am speaking with the correct person using two identifiers.   I discussed the limitations of evaluation and management by telemedicine. The patient expressed understanding and agreed to proceed.  Location patient: home  Location provider: work  Persons participating in the virtual visit: patient, CMA    Review of Systems    Defer to PCP.  Cardiac Risk Factors include: advanced age (>64mn, >>36women);diabetes mellitus     Objective:    Today's Vitals   05/27/21 0932  PainSc: 0-No pain   There is no height or weight on file to calculate BMI.  Advanced Directives 05/27/2021 07/02/2019 01/26/2019 09/15/2014  Does Patient Have a Medical Advance Directive? No No No No  Would patient like information on creating a medical advance directive? No - Patient declined - - No - patient declined information    Current Medications (verified) Outpatient Encounter Medications as of 05/27/2021  Medication Sig   aspirin EC 81 MG tablet Take 1 tablet (81 mg total) by mouth daily. Swallow whole.   atorvastatin (LIPITOR) 20 MG tablet Take 1 tablet (20 mg total) by mouth daily.   gabapentin (NEURONTIN) 100 MG capsule Take 1 capsule (100 mg total) by mouth at bedtime.   glipiZIDE (GLUCOTROL) 10 MG tablet 1 tab by mouth twice daily with meals   losartan (COZAAR) 50 MG tablet 1 tab by mouth daily in the morning with meal   metFORMIN (GLUCOPHAGE) 1000 MG tablet Take 1 tablet (1,000 mg total) by mouth 2 (two) times daily with a meal.   metoprolol tartrate (LOPRESSOR) 25 MG tablet Take 1 tablet (25 mg total) by mouth 2 (two) times daily.   blood glucose meter kit and supplies Dispense based on patient and insurance preference. Use up to four times daily as directed. (FOR ICD-10 E10.9, E11.9). (Patient  not taking: No sig reported)   OneTouch Delica Lancets 393OMISC USE AS DIRECTED (Patient not taking: No sig reported)   Tea Tree Oil OIL Mix with [place] Foot cream and apply to feet but not in between toes after bathing (Patient not taking: No sig reported)   terbinafine (LAMISIL) 1 % cream Apply twice daily to feet but not in between toes for at least 14 days or until flaking resolved. (Patient not taking: No sig reported)   No facility-administered encounter medications on file as of 05/27/2021.    Allergies (verified) Patient has no known allergies.   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 No pertinent surgical history. Family History  Problem Relation Age of Onset   Cancer Mother        Not clear of primary   Diabetes Mother    Hypertension Mother    Hypertension Father    Stroke Father    Hyperlipidemia Father    HIV/AIDS Sister    Cancer Brother        Colon   Arthritis Sister    Diabetes Brother    Social History   Socioeconomic History   Marital status: Single  Spouse name: Not on file   Number of children: Not on file   Years of education: Not on file   Highest education level: Not on file  Occupational History   Occupation: [employer]    Comment: retired  Tobacco Use   Smoking status: Never   Smokeless tobacco: Never  Substance and Sexual Activity   Alcohol use: Not Currently    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  Restarted and drinking 2 40 oz per week.   Drug use: No   Sexual activity: Not on file  Other Topics Concern   Not on file  Social History Narrative   Lives alone in [place]     Sister, [place], checks in with him twice weekly.       Social Determinants of  Health   Financial Resource Strain: Low Risk    Difficulty of Paying Living Expenses: Not hard at all  Food Insecurity: No Food Insecurity   Worried About Charity fundraiser in the Last Year: Never true   [place] in the Last Year: Never true  Transportation Needs: No Transportation Needs   Lack of Transportation (Medical): No   Lack of Transportation (Non-Medical): No  Physical Activity: Not on file  Stress: No Stress Concern Present   Feeling of Stress : Not at all  Social Connections: Socially Isolated   Frequency of Communication with Friends and Family: Three times a week   Frequency of Social Gatherings with Friends and Family: Once a week   Attends Religious Services: Never   Marine scientist or Organizations: No   Attends Music therapist: Never   Marital Status: Never married    Tobacco Counseling Counseling given: Not Answered   Clinical Intake:  Pre-visit preparation completed: Yes  Pain : No/denies pain Pain Score: 0-No pain     Diabetes: Yes CBG done?: No Did pt. bring in CBG monitor from home?: No  How often do you need to have someone help you when you read instructions, pamphlets, or other written materials from your doctor or pharmacy?: 4 - Often What is the last grade level you completed in school?: 12th grade  Diabetic?Yes  Interpreter Needed?: No      Activities of Daily Living In your present state of health, do you have any difficulty performing the following activities: 05/27/2021 05/27/2021  Hearing? Tempie Donning  Vision? N N  Difficulty concentrating or making decisions? N N  Walking or climbing stairs? N N  Dressing or bathing? N N  Doing errands, shopping? N N  Preparing Food and eating ? N N  Using the Toilet? N -  In the past six months, have you accidently leaked urine? Y -  Do you have problems with loss of bowel control? Y -  Managing your Medications? N -  Managing your Finances? N -  Housekeeping or  managing your Housekeeping? N -  Some recent data might be hidden    Patient Care Team: Mack Hook, MD as PCP - General (Internal Medicine)  Indicate any recent Medical Services you may have received from other than [facility] providers in the past year (date may be approximate).     Assessment:   This is a routine wellness examination for [place].  Hearing/Vision screen No results found.  Dietary issues and exercise activities discussed: Current Exercise Habits: Structured exercise class;Home exercise routine, Type of exercise: strength training/weights, Time (Minutes): 30, Frequency (Times/Week):  2, Weekly Exercise (Minutes/Week): 60, Intensity: Mild, Exercise limited by: None identified   Goals Addressed   None   Depression Screen PHQ 2/9 Scores 05/27/2021 06/03/2020 07/24/2019 04/23/2019 04/09/2019  PHQ - 2 Score 0 0 0 2 0  PHQ- 9 Score - 2 - 18 -    Fall Risk Fall Risk  05/27/2021 07/24/2019 04/23/2019 04/09/2019  Falls in the past year? 1 0 0 0  Number falls in past yr: 0 - - -  Injury with Fall? 0 - - -  Risk for fall due to : History of fall(s) - - -  Follow up Falls evaluation completed - - -    FALL RISK PREVENTION PERTAINING TO THE HOME:  Any stairs in or around the home? Yes  If so, are there any without handrails? Yes  Home free of loose throw rugs in walkways, pet beds, electrical cords, etc? Yes  Adequate lighting in your home to reduce risk of falls? Yes   ASSISTIVE DEVICES UTILIZED TO PREVENT FALLS:  Life alert? No  Use of a cane, walker or w/c? No  Grab bars in the bathroom? Yes  Shower chair or bench in shower? Yes  Elevated toilet seat or a handicapped toilet? No   TIMED UP AND GO:  Was the test performed? No .   Cognitive Function:     6CIT Screen 05/27/2021  What Year? 0 points  What month? 0 points  What time? 0 points  Count back from 20 2 points  Months in reverse 4 points  Repeat phrase 10 points  Total Score 16     Immunizations Immunization History  Administered Date(s) Administered   Influenza, High Dose Seasonal PF 08/06/2018   Influenza,inj,Quad PF,6+ Mos 07/24/2019   PFIZER Comirnaty(Gray Top)Covid-19 Tri-Sucrose Vaccine 03/01/2021   PFIZER(Purple Top)SARS-COV-2 Vaccination 11/07/2019, 11/28/2019, 08/01/2020   Pneumococcal Conjugate-13 04/09/2019   Pneumococcal Polysaccharide-23 09/02/2020   Tdap 01/26/2019, 04/09/2019    TDAP status: Up to date  Flu Vaccine status: Due, Education has been provided regarding the importance of this vaccine. Advised may receive this vaccine at local pharmacy or Health Dept. Aware to provide a copy of the vaccination record if obtained from local pharmacy or Health Dept. Verbalized acceptance and understanding.  Pneumococcal vaccine status: Up to date  Covid-19 vaccine status: Completed vaccines  Qualifies for Shingles Vaccine? Yes   Zostavax completed No   Shingrix Completed?: No.    Education has been provided regarding the importance of this vaccine. Patient has been advised to call insurance company to determine out of pocket expense if they have not yet received this vaccine. Advised may also receive vaccine at local pharmacy or Health Dept. Verbalized acceptance and understanding.  Screening Tests Health Maintenance  Topic Date Due   Zoster Vaccines- Shingrix (1 of 2) Never done   COLONOSCOPY (Pts 45-34yr Insurance coverage will need to be confirmed)  Never done   INFLUENZA VACCINE  05/01/2021   COVID-19 Vaccine (5 - Booster for Pfizer series) 07/01/2021   OPHTHALMOLOGY EXAM  07/06/2021   HEMOGLOBIN A1C  08/31/2021   FOOT EXAM  09/02/2021   TETANUS/TDAP  04/08/2029   Hepatitis C Screening  Completed   PNA vac Low Risk Adult  Completed   HPV VACCINES  Aged Out    Health Maintenance  Health Maintenance Due  Topic Date Due   Zoster Vaccines- Shingrix (1 of 2) Never done   COLONOSCOPY (Pts 45-466yrInsurance coverage will need to be  confirmed)  Never done  INFLUENZA VACCINE  05/01/2021    Colorectal cancer screening: Referral to GI placed Patient declined. Pt aware the office will call re: appt.  Lung Cancer Screening: (Low Dose CT Chest recommended if Age 31-80 years, 30 pack-year currently smoking OR have quit w/in 15years.) does not qualify.   Lung Cancer Screening Referral: N/A  Additional Screening:  Hepatitis C Screening: does not qualify; Completed 07/24/2019.  Vision Screening: Recommended annual ophthalmology exams for early detection of glaucoma and other disorders of the eye. Is the patient up to date with their annual eye exam?  Yes  Who is the provider or what is the name of the office in which the patient attends annual eye exams? [facility] If pt is not established with a provider, would they like to be referred to a provider to establish care?  N/A .   Dental Screening: Recommended annual dental exams for proper oral hygiene  Community Resource Referral / Chronic Care Management: CRR required this visit?  No   CCM required this visit?  No      Plan:     I have personally reviewed and noted the following in the patient's chart:   Medical and social history Use of alcohol, tobacco or illicit drugs  Current medications and supplements including opioid prescriptions. Patient is not currently taking opioid prescriptions. Functional ability and status Nutritional status Physical activity Advanced directives List of other physicians Hospitalizations, surgeries, and ER visits in previous 12 months Vitals Screenings to include cognitive, depression, and falls Referrals and appointments  In addition, I have reviewed and discussed with patient certain preventive protocols, quality metrics, and best practice recommendations. A written personalized care plan for preventive services as well as general preventive health recommendations were provided to patient.     Thressa Sheller,  [place]   05/27/2021   Nurse Notes: Non-face to face 27 minute visit.  Mr. Rains , Thank you for taking time to come for your Medicare Wellness Visit. I appreciate your ongoing commitment to your health goals. Please review the following plan we discussed and let me know if I can assist you in the future.   These are the goals we discussed:  Goals   None     This is a list of the screening recommended for you and due dates:  Health Maintenance  Topic Date Due   Zoster (Shingles) Vaccine (1 of 2) Never done   Colon Cancer Screening  Never done   Flu Shot  05/01/2021   COVID-19 Vaccine (5 - Booster for Pfizer series) 07/01/2021   Eye exam for diabetics  07/06/2021   Hemoglobin A1C  08/31/2021   Complete foot exam   09/02/2021   Tetanus Vaccine  04/08/2029   Hepatitis C Screening: USPSTF Recommendation to screen - Ages 18-79 yo.  Completed   Pneumonia vaccines  Completed   HPV Vaccine  Aged Out

## 2021-05-27 NOTE — Patient Instructions (Signed)
Health Maintenance, Male Adopting a healthy lifestyle and getting preventive care are important in promoting health and wellness. Ask your health care provider about: The right schedule for you to have regular tests and exams. Things you can do on your own to prevent diseases and keep yourself healthy. What should I know about diet, weight, and exercise? Eat a healthy diet  Eat a diet that includes plenty of vegetables, fruits, low-fat dairy products, and lean protein. Do not eat a lot of foods that are high in solid fats, added sugars, or sodium.  Maintain a healthy weight Body mass index (BMI) is a measurement that can be used to identify possible weight problems. It estimates body fat based on height and weight. Your health care provider can help determine your BMI and help you achieve or maintain ahealthy weight. Get regular exercise Get regular exercise. This is one of the most important things you can do for your health. Most adults should: Exercise for at least 150 minutes each week. The exercise should increase your heart rate and make you sweat (moderate-intensity exercise). Do strengthening exercises at least twice a week. This is in addition to the moderate-intensity exercise. Spend less time sitting. Even light physical activity can be beneficial. Watch cholesterol and blood lipids Have your blood tested for lipids and cholesterol at 69 years of age, then havethis test every 5 years. You may need to have your cholesterol levels checked more often if: Your lipid or cholesterol levels are high. You are older than 69 years of age. You are at high risk for heart disease. What should I know about cancer screening? Many types of cancers can be detected early and may often be prevented. Depending on your health history and family history, you may need to have cancer screening at various ages. This may include screening for: Colorectal cancer. Prostate cancer. Skin cancer. Lung  cancer. What should I know about heart disease, diabetes, and high blood pressure? Blood pressure and heart disease High blood pressure causes heart disease and increases the risk of stroke. This is more likely to develop in people who have high blood pressure readings, are of African descent, or are overweight. Talk with your health care provider about your target blood pressure readings. Have your blood pressure checked: Every 3-5 years if you are 18-39 years of age. Every year if you are 40 years old or older. If you are between the ages of 65 and 75 and are a current or former smoker, ask your health care provider if you should have a one-time screening for abdominal aortic aneurysm (AAA). Diabetes Have regular diabetes screenings. This checks your fasting blood sugar level. Have the screening done: Once every three years after age 45 if you are at a normal weight and have a low risk for diabetes. More often and at a younger age if you are overweight or have a high risk for diabetes. What should I know about preventing infection? Hepatitis B If you have a higher risk for hepatitis B, you should be screened for this virus. Talk with your health care provider to find out if you are at risk forhepatitis B infection. Hepatitis C Blood testing is recommended for: Everyone born from 1945 through 1965. Anyone with known risk factors for hepatitis C. Sexually transmitted infections (STIs) You should be screened each year for STIs, including gonorrhea and chlamydia, if: You are sexually active and are younger than 69 years of age. You are older than 69 years of age   and your health care provider tells you that you are at risk for this type of infection. Your sexual activity has changed since you were last screened, and you are at increased risk for chlamydia or gonorrhea. Ask your health care provider if you are at risk. Ask your health care provider about whether you are at high risk for HIV.  Your health care provider may recommend a prescription medicine to help prevent HIV infection. If you choose to take medicine to prevent HIV, you should first get tested for HIV. You should then be tested every 3 months for as long as you are taking the medicine. Follow these instructions at home: Lifestyle Do not use any products that contain nicotine or tobacco, such as cigarettes, e-cigarettes, and chewing tobacco. If you need help quitting, ask your health care provider. Do not use street drugs. Do not share needles. Ask your health care provider for help if you need support or information about quitting drugs. Alcohol use Do not drink alcohol if your health care provider tells you not to drink. If you drink alcohol: Limit how much you have to 0-2 drinks a day. Be aware of how much alcohol is in your drink. In the [place] one drink equals one 12 oz bottle of beer (355 mL), one 5 oz glass of wine (148 mL), or one 1 oz glass of hard liquor (44 mL). General instructions Schedule regular health, dental, and eye exams. Stay current with your vaccines. Tell your health care provider if: You often feel depressed. You have ever been abused or do not feel safe at home. Summary Adopting a healthy lifestyle and getting preventive care are important in promoting health and wellness. Follow your health care provider's instructions about healthy diet, exercising, and getting tested or screened for diseases. Follow your health care provider's instructions on monitoring your cholesterol and blood pressure. This information is not intended to replace advice given to you by your health care provider. Make sure you discuss any questions you have with your healthcare provider. Document Revised: 09/10/2018 Document Reviewed: 09/10/2018 Elsevier Patient Education  2022 [employer]vier Inc[employer]  

## 2021-06-12 ENCOUNTER — Encounter (INDEPENDENT_AMBULATORY_CARE_PROVIDER_SITE_OTHER): Payer: Medicare HMO | Admitting: Ophthalmology

## 2021-06-13 ENCOUNTER — Other Ambulatory Visit: Payer: Self-pay

## 2021-06-13 ENCOUNTER — Ambulatory Visit (INDEPENDENT_AMBULATORY_CARE_PROVIDER_SITE_OTHER): Payer: Medicare HMO | Admitting: Ophthalmology

## 2021-06-13 ENCOUNTER — Telehealth: Payer: Self-pay

## 2021-06-13 ENCOUNTER — Encounter (INDEPENDENT_AMBULATORY_CARE_PROVIDER_SITE_OTHER): Payer: Self-pay | Admitting: Ophthalmology

## 2021-06-13 DIAGNOSIS — E113411 Type 2 diabetes mellitus with severe nonproliferative diabetic retinopathy with macular edema, right eye: Secondary | ICD-10-CM | POA: Diagnosis not present

## 2021-06-13 DIAGNOSIS — H43821 Vitreomacular adhesion, right eye: Secondary | ICD-10-CM

## 2021-06-13 MED ORDER — BEVACIZUMAB 2.5 MG/0.1ML IZ SOSY
2.5000 mg | PREFILLED_SYRINGE | INTRAVITREAL | Status: AC | PRN
  Administered 2021-06-13: 2.5 mg via INTRAVITREAL

## 2021-06-13 NOTE — Telephone Encounter (Signed)
Needs glucometer sent [facility] [address]

## 2021-06-13 NOTE — Telephone Encounter (Signed)
Would also like to discuss what needs to be done to get patient a form of assisted care/ nurse to help make sure he is doing everything he is supposed to

## 2021-06-13 NOTE — Assessment & Plan Note (Signed)
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  Follow-up next in 4 weeks and evaluate whether thickening improved substantially and if not may consider vitrectomy.  These findings were reviewed with the patient and family

## 2021-06-13 NOTE — Progress Notes (Signed)
06/13/2021     CHIEF COMPLAINT Patient presents for  Chief Complaint  Patient presents with   Retina Follow Up      HISTORY OF PRESENT ILLNESS: Brandon Sullivan is a 70 y.o. male who presents to the clinic today for:   HPI     Retina Follow Up   Patient presents with  Diabetic Retinopathy.  In right eye.  This started 6 weeks ago.  Severity is severe.  Duration of 6 weeks.        Comments   6 week f/u OD with OCT and  possible Avastin  Pt states vision seems about the same as his previous visit. Pt denies any new floaters, or flashes. Pt denies any eye pain.   Type 2 Diabetic. Diagnosed for ~ 2 years. A1c: unknown BS: unknown  Eye Meds: None      Last edited by Reather Littler, COA on 06/13/2021  8:26 AM.      Referring physician: Mack Hook, MD [place],  [place] [address]  HISTORICAL INFORMATION:   Selected notes from the MEDICAL RECORD NUMBER    Lab Results  Component Value Date   HGBA1C 5.9 (H) 03/01/2021     CURRENT MEDICATIONS: No current outpatient medications on file. (Ophthalmic Drugs)   No current facility-administered medications for this visit. (Ophthalmic Drugs)   Current Outpatient Medications (Other)  Medication Sig   aspirin EC 81 MG tablet Take 1 tablet (81 mg total) by mouth daily. Swallow whole.   atorvastatin (LIPITOR) 20 MG tablet Take 1 tablet (20 mg total) by mouth daily.   blood glucose meter kit and supplies Dispense based on patient and insurance preference. Use up to four times daily as directed. (FOR ICD-10 E10.9, E11.9). (Patient not taking: No sig reported)   gabapentin (NEURONTIN) 100 MG capsule Take 1 capsule (100 mg total) by mouth at bedtime.   glipiZIDE (GLUCOTROL) 10 MG tablet 1 tab by mouth twice daily with meals   losartan (COZAAR) 50 MG tablet 1 tab by mouth daily in the morning with meal   metFORMIN (GLUCOPHAGE) 1000 MG tablet Take 1 tablet (1,000 mg total) by mouth 2 (two) times daily with a  meal.   metoprolol tartrate (LOPRESSOR) 25 MG tablet Take 1 tablet (25 mg total) by mouth 2 (two) times daily.   OneTouch Delica Lancets 54S MISC USE AS DIRECTED (Patient not taking: No sig reported)   Tea Tree Oil OIL Mix with Gold Bond Foot cream and apply to feet but not in between toes after bathing (Patient not taking: No sig reported)   terbinafine (LAMISIL) 1 % cream Apply twice daily to feet but not in between toes for at least 14 days or until flaking resolved. (Patient not taking: No sig reported)   No current facility-administered medications for this visit. (Other)      REVIEW OF SYSTEMS:    ALLERGIES No Known Allergies  PAST MEDICAL HISTORY Past Medical History:  Diagnosis Date   Back pain    Diabetes mellitus without complication ([place])    Hypertension    Medical history unknown    Nuclear sclerotic cataract of left eye 07/06/2020   TBI (traumatic brain injury) ([place]) 12/2018   History reviewed. No pertinent surgical history.  FAMILY HISTORY Family History  Problem Relation Age of Onset   Cancer Mother        Not clear of primary   Diabetes Mother    Hypertension Mother    Hypertension  Father    Stroke Father    Hyperlipidemia Father    HIV/AIDS Sister    Cancer Brother        Colon   Arthritis Sister    Diabetes Brother     SOCIAL HISTORY Social History   Tobacco Use   Smoking status: Never   Smokeless tobacco: Never  Substance Use Topics   Alcohol use: Not Currently    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  Restarted and drinking 2 40 oz per week.   Drug use: No         OPHTHALMIC EXAM:  Base Eye Exam     Visual Acuity (ETDRS)       Right Left   Dist [place] 20/100 20/30 +1   Dist ph [place] NI NI         Tonometry (Tonopen, 8:32 AM)       Right Left   Pressure 10 13         Pupils       Pupils Dark Light Shape React APD   Right PERRL 4 3 Round Brisk None   Left PERRL 4 3 Round Brisk None          Visual Fields (Counting fingers)       Left Right    Full Full         Extraocular Movement       Right Left    Full, Ortho Full, Ortho         Neuro/Psych     Oriented x3: Yes   Mood/Affect: Normal         Dilation     Right eye: 1.0% Mydriacyl, 2.5% Phenylephrine @ 8:32 AM           Slit Lamp and Fundus Exam     External Exam       Right Left   External Normal Normal         Slit Lamp Exam       Right Left   Lids/Lashes Normal Normal   Conjunctiva/Sclera White and quiet White and quiet   Cornea Clear Clear   Anterior Chamber Deep and quiet Deep and quiet   Iris Round and reactive Round and reactive   Lens Centered posterior chamber intraocular lens 3+ Nuclear sclerosis   Anterior Vitreous Normal Normal         Fundus Exam       Right Left   Posterior Vitreous Normal, no posterior vitreous detachment    Disc Normal    C/D Ratio 0.35    Macula Severe clinically significant macular edema    Vessels NPDR-Severe    Periphery Normal             IMAGING AND PROCEDURES  Imaging and Procedures for 06/13/21  OCT, Retina - OU - Both Eyes       Right Eye Quality was good. Scan locations included subfoveal. Central Foveal Thickness: 396. Progression has worsened. Findings include cystoid macular edema, vitreomacular adhesion , vitreous traction.   Left Eye Quality was good. Scan locations included subfoveal. Central Foveal Thickness: 291. Progression has been stable. Findings include cystoid macular edema.   Notes Massive CSME OD, from severe NPDR.  Will recommend injection intravitreal Avastin today and may need to consider vitrectomy for ongoing vitreomacular traction which exacerbates the ongoing thickening of the temporal aspect of the macula OD  OS with very minor retinal thickening and in fact much improved  from December 2020, his last visit date.  I will recommend observe the left eye at this time.     Intravitreal  Injection, Pharmacologic Agent - OD - Right Eye       Time Out 06/13/2021. 9:14 AM. Confirmed correct patient, procedure, site, and patient consented.   Anesthesia Topical anesthesia was used. Anesthetic medications included Akten 3.5%.   Procedure Preparation included Tobramycin 0.3%, 10% betadine to eyelids, 5% betadine to ocular surface. A 30 gauge needle was used.   Injection: 2.5 mg bevacizumab 2.5 MG/0.1ML   Route: Intravitreal, Site: Right Eye   NDC: [phone], Lot: [id number]   Post-op Post injection exam found visual acuity of at least counting fingers. The patient tolerated the procedure well. There were no complications. The patient received written and verbal post procedure care education. Post injection medications were not given.              ASSESSMENT/PLAN:  Vitreomacular traction, right Clearly a component of ongoing CSME on the temporal aspect of the macula in the region of vitreomacular adhesion.  Follow-up next in 4 weeks and evaluate whether thickening improved substantially and if not may consider vitrectomy.  These findings were reviewed with the patient and family  Severe nonproliferative diabetic retinopathy of right eye, with macular edema, associated with type 2 diabetes mellitus (HCC) Minor improvement in CSME will repeat injection today and evaluate next in 4 weeks and if much less CME, will continue to treat.  If not vitreomacular adhesion and traction is playing a component and ongoing thickening     ICD-10-CM   1. Severe nonproliferative diabetic retinopathy of right eye, with macular edema, associated with type 2 diabetes mellitus (HCC)  E11.3411 OCT, Retina - OU - Both Eyes    Intravitreal Injection, Pharmacologic Agent - OD - Right Eye    bevacizumab (AVASTIN) SOSY 2.5 mg    2. Vitreomacular traction, right  H43.821       1.  OD with chronic CSME yet slightly improved.  Some component of vitreomacular adhesion and traction OD.  Repeat  injection today and evaluate again in 1 month and if no substantial improvement may need to consider vitrectomy  2.  Eylea OU next, possible Avastin OD in 1 month  3.  Ophthalmic Meds Ordered this visit:  Meds ordered this encounter  Medications   bevacizumab (AVASTIN) SOSY 2.5 mg       Return in about 1 month (around 07/13/2021) for DILATE OU, AVASTIN OCT, OD,, discussed vitrectomy.  There are no Patient Instructions on file for this visit.   Explained the diagnoses, plan, and follow up with the patient and they expressed understanding.  Patient expressed understanding of the importance of proper follow up care.   Clent Demark Kollen Armenti M.D. Diseases & Surgery of the Retina and Vitreous Retina & Diabetic [place] 06/13/21     Abbreviations: M myopia (nearsighted); A astigmatism; H hyperopia (farsighted); P presbyopia; Mrx spectacle prescription;  CTL contact lenses; OD right eye; OS left eye; OU both eyes  XT exotropia; ET esotropia; PEK punctate epithelial keratitis; PEE punctate epithelial erosions; DES dry eye syndrome; MGD meibomian gland dysfunction; ATs artificial tears; PFAT's preservative free artificial tears; [place] nuclear sclerotic cataract; PSC posterior subcapsular cataract; ERM epi-retinal membrane; PVD posterior vitreous detachment; RD retinal detachment; DM diabetes mellitus; DR diabetic retinopathy; NPDR non-proliferative diabetic retinopathy; PDR proliferative diabetic retinopathy; CSME clinically significant macular edema; DME diabetic macular edema; dbh dot blot hemorrhages; CWS cotton wool spot; POAG primary open  angle glaucoma; C/D cup-to-disc ratio; HVF humphrey visual field; GVF goldmann visual field; OCT optical coherence tomography; IOP intraocular pressure; BRVO Branch retinal vein occlusion; CRVO central retinal vein occlusion; CRAO central retinal artery occlusion; BRAO branch retinal artery occlusion; RT retinal tear; SB scleral buckle; PPV pars plana vitrectomy;  VH Vitreous hemorrhage; PRP panretinal laser photocoagulation; IVK intravitreal kenalog; VMT vitreomacular traction; MH Macular hole;  NVD neovascularization of the disc; NVE neovascularization elsewhere; AREDS age related eye disease study; ARMD age related macular degeneration; POAG primary open angle glaucoma; EBMD epithelial/anterior basement membrane dystrophy; ACIOL anterior chamber intraocular lens; IOL intraocular lens; PCIOL posterior chamber intraocular lens; Phaco/IOL phacoemulsification with intraocular lens placement; [place] photorefractive keratectomy; LASIK laser assisted in situ keratomileusis; HTN hypertension; DM diabetes mellitus; COPD chronic obstructive pulmonary disease

## 2021-06-13 NOTE — Assessment & Plan Note (Signed)
Minor improvement in CSME will repeat injection today and evaluate next in 4 weeks and if much less CME, will continue to treat.  If not vitreomacular adhesion and traction is playing a component and ongoing thickening

## 2021-06-26 NOTE — Telephone Encounter (Signed)
He could possibly get CAP through Medicaid--appears he has both Medicare and Medicaid.   Please find out what his ADLs needs are besides having someone make sure he is taking meds.

## 2021-06-27 NOTE — Telephone Encounter (Signed)
Spoke with Pt sister, [name], she will look into getting him into the CAP program. Some of the tasks that he needs help with include: taking medications regularly, bathing, getting to appointments because he get confused about when and where they are at, and cooking meals.   Pt also needs a new glucometer, at the moment he tried to get a new one, but without prescription he his being told he has to pay full price out of pocket

## 2021-07-04 ENCOUNTER — Ambulatory Visit: Payer: Medicare (Managed Care) | Admitting: Internal Medicine

## 2021-07-13 ENCOUNTER — Encounter (INDEPENDENT_AMBULATORY_CARE_PROVIDER_SITE_OTHER): Payer: Medicare HMO | Admitting: Ophthalmology

## 2021-07-17 ENCOUNTER — Other Ambulatory Visit: Payer: Self-pay

## 2021-07-17 ENCOUNTER — Ambulatory Visit (INDEPENDENT_AMBULATORY_CARE_PROVIDER_SITE_OTHER): Payer: Medicare HMO | Admitting: Ophthalmology

## 2021-07-17 ENCOUNTER — Encounter (INDEPENDENT_AMBULATORY_CARE_PROVIDER_SITE_OTHER): Payer: Self-pay | Admitting: Ophthalmology

## 2021-07-17 DIAGNOSIS — E113492 Type 2 diabetes mellitus with severe nonproliferative diabetic retinopathy without macular edema, left eye: Secondary | ICD-10-CM

## 2021-07-17 DIAGNOSIS — H43821 Vitreomacular adhesion, right eye: Secondary | ICD-10-CM

## 2021-07-17 DIAGNOSIS — E113411 Type 2 diabetes mellitus with severe nonproliferative diabetic retinopathy with macular edema, right eye: Secondary | ICD-10-CM | POA: Diagnosis not present

## 2021-07-17 MED ORDER — BEVACIZUMAB 2.5 MG/0.1ML IZ SOSY
2.5000 mg | PREFILLED_SYRINGE | INTRAVITREAL | Status: AC | PRN
  Administered 2021-07-17: 2.5 mg via INTRAVITREAL

## 2021-07-17 NOTE — Assessment & Plan Note (Signed)
OS improved CSME overall, not active disease OS we will continue to observe

## 2021-07-17 NOTE — Progress Notes (Signed)
07/17/2021     CHIEF COMPLAINT Patient presents for  Chief Complaint  Patient presents with   Retina Follow Up      HISTORY OF PRESENT ILLNESS: Brandon Sullivan is a 70 y.o. male who presents to the clinic today for:   HPI     Retina Follow Up   Patient presents with  Diabetic Retinopathy.  In both eyes.  This started 1 month ago.  Severity is severe.  Duration of 1 month.        Comments   1 month f/u OU with OCT and possible Avastin injection OD  Pt denies any visual changes since previous visit. Pt denies any new floaters or flashes. Pt denies any eye pain.  Type 2 Diabetic. Diagnosed ~ 2 years. A1c: unknown BS: unknown; doesn't check  EyeMeds:  None      Last edited by Reather Littler, COA on 07/17/2021  8:18 AM.      Referring physician: Mack Hook, MD [place],  [place] [id number]  HISTORICAL INFORMATION:   Selected notes from the MEDICAL RECORD NUMBER    Lab Results  Component Value Date   HGBA1C 5.9 (H) 03/01/2021     CURRENT MEDICATIONS: No current outpatient medications on file. (Ophthalmic Drugs)   No current facility-administered medications for this visit. (Ophthalmic Drugs)   Current Outpatient Medications (Other)  Medication Sig   aspirin EC 81 MG tablet Take 1 tablet (81 mg total) by mouth daily. Swallow whole.   atorvastatin (LIPITOR) 20 MG tablet Take 1 tablet (20 mg total) by mouth daily.   blood glucose meter kit and supplies Dispense based on patient and insurance preference. Use up to four times daily as directed. (FOR ICD-10 E10.9, E11.9). (Patient not taking: No sig reported)   gabapentin (NEURONTIN) 100 MG capsule Take 1 capsule (100 mg total) by mouth at bedtime.   glipiZIDE (GLUCOTROL) 10 MG tablet 1 tab by mouth twice daily with meals   losartan (COZAAR) 50 MG tablet 1 tab by mouth daily in the morning with meal   metFORMIN (GLUCOPHAGE) 1000 MG tablet Take 1 tablet (1,000 mg total) by mouth 2 (two) times  daily with a meal.   metoprolol tartrate (LOPRESSOR) 25 MG tablet Take 1 tablet (25 mg total) by mouth 2 (two) times daily.   OneTouch Delica Lancets 28M MISC USE AS DIRECTED (Patient not taking: No sig reported)   Tea Tree Oil OIL Mix with Gold Bond Foot cream and apply to feet but not in between toes after bathing (Patient not taking: No sig reported)   terbinafine (LAMISIL) 1 % cream Apply twice daily to feet but not in between toes for at least 14 days or until flaking resolved. (Patient not taking: No sig reported)   No current facility-administered medications for this visit. (Other)      REVIEW OF SYSTEMS: ROS   Negative for: Constitutional, Gastrointestinal, Neurological, Skin, Genitourinary, Musculoskeletal, HENT, Endocrine, Cardiovascular, Eyes, Respiratory, Psychiatric, Allergic/Imm, Heme/Lymph Last edited by Hurman Horn, MD on 07/17/2021  9:07 AM.       ALLERGIES No Known Allergies  PAST MEDICAL HISTORY Past Medical History:  Diagnosis Date   Back pain    Diabetes mellitus without complication ([place])    Hypertension    Medical history unknown    Nuclear sclerotic cataract of left eye 07/06/2020   TBI (traumatic brain injury) 12/2018   History reviewed. No pertinent surgical history.  FAMILY HISTORY Family History  Problem Relation  Age of Onset   Cancer Mother        Not clear of primary   Diabetes Mother    Hypertension Mother    Hypertension Father    Stroke Father    Hyperlipidemia Father    HIV/AIDS Sister    Cancer Brother        Colon   Arthritis Sister    Diabetes Brother     SOCIAL HISTORY Social History   Tobacco Use   Smoking status: Never   Smokeless tobacco: Never  Substance Use Topics   Alcohol use: Not Currently    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  Restarted and drinking 2 40 oz per week.   Drug use: No         OPHTHALMIC EXAM:  Base Eye Exam     Visual Acuity (ETDRS)       Right Left    Dist [place] 20/60 -1 20/30 -2   Dist ph [place] NI          Tonometry (Tonopen, 8:24 AM)       Right Left   Pressure 19 21  Pt squeezing significantly OU        Pupils       Pupils Dark Light Shape React APD   Right PERRL 4 3 Round Brisk None   Left PERRL 4 3 Round Brisk None         Visual Fields (Counting fingers)       Left Right    Full Full         Extraocular Movement       Right Left    Full, Ortho Full, Ortho         Neuro/Psych     Oriented x3: Yes   Mood/Affect: Normal         Dilation     Both eyes: 1.0% Mydriacyl, 2.5% Phenylephrine @ 8:24 AM           Slit Lamp and Fundus Exam     External Exam       Right Left   External Normal Normal         Slit Lamp Exam       Right Left   Lids/Lashes Normal Normal   Conjunctiva/Sclera White and quiet White and quiet   Cornea Clear Clear   Anterior Chamber Deep and quiet Deep and quiet   Iris Round and reactive Round and reactive   Lens Centered posterior chamber intraocular lens 3+ Nuclear sclerosis   Anterior Vitreous Normal Normal         Fundus Exam       Right Left   Posterior Vitreous Normal, no posterior vitreous detachment Normal, no posterior vitreous detachment   Disc Normal Normal   C/D Ratio 0.35 0.35   Macula Severe clinically significant macular edema no clinically significant macular edema, no drusen, no exudates, no macular thickening   Vessels NPDR-Severe NPDR-Severe   Periphery Normal Normal            IMAGING AND PROCEDURES  Imaging and Procedures for 07/17/21  OCT, Retina - OU - Both Eyes       Right Eye Quality was good. Scan locations included subfoveal. Central Foveal Thickness: 382. Progression has worsened. Findings include cystoid macular edema, vitreomacular adhesion , vitreous traction.   Left Eye Quality was good. Scan locations included subfoveal. Central Foveal Thickness: 279. Progression has been stable. Findings include cystoid macular  edema.   Notes Massive CSME OD, from severe NPDR.  Improving CSME however as compared to the last 3 months, will recommend injection intravitreal Avastin today and may need to consider vitrectomy for ongoing vitreomacular traction which exacerbates the ongoing thickening of the temporal aspect of the macula OD  OS with very minor retinal thickening and in fact much improved from December 2020 as well as improved from last visit September 2022.  I will recommend observe the left eye at this time.     Intravitreal Injection, Pharmacologic Agent - OD - Right Eye       Time Out 07/17/2021. 9:09 AM. Confirmed correct patient, procedure, site, and patient consented.   Anesthesia Topical anesthesia was used. Anesthetic medications included Akten 3.5%.   Procedure Preparation included Tobramycin 0.3%, 10% betadine to eyelids, 5% betadine to ocular surface. A 30 gauge needle was used.   Injection: 2.5 mg bevacizumab 2.5 MG/0.1ML   Route: Intravitreal, Site: Right Eye   NDC: [phone]   Post-op Post injection exam found visual acuity of at least counting fingers. The patient tolerated the procedure well. There were no complications. The patient received written and verbal post procedure care education. Post injection medications included ocuflox.              ASSESSMENT/PLAN:  Severe nonproliferative diabetic retinopathy of right eye, with macular edema, associated with type 2 diabetes mellitus ([place])  The nature of diabetic macular edema was discussed with the patient. Treatment options were outlined including medical therapy, laser & vitrectomy. The use of injectable medications reviewed, including Avastin, Lucentis, and Eylea. Periodic injections into the eye are likely to resolve diabetic macular edema (swelling in the center of vision). Initially, injections are delivered are delivered every 4-6 weeks, and the interval extended as the condition improves. On average, 8-9 injections  the first year, and 5 in year 2. Improvement in the condition most often improves on medical therapy. Occasional use of focal laser is also recommended for residual macular edema (swelling). Excellent control of blood glucose and blood pressure are encouraged under the care of a primary physician or endocrinologist. Similarly, attempts to maintain serum cholesterol, low density lipoproteins, and high-density lipoproteins in a favorable range were recommended.  OD still active yet improving CSME center involved.  We will continue to monitor role of VMT  Vitreomacular traction, right OD may have some role on persistent thickening and CSME  Severe nonproliferative diabetic retinopathy of left eye (HCC) OS improved CSME overall, not active disease OS we will continue to observe     ICD-10-CM   1. Severe nonproliferative diabetic retinopathy of right eye, with macular edema, associated with type 2 diabetes mellitus (HCC)  E11.3411 OCT, Retina - OU - Both Eyes    Intravitreal Injection, Pharmacologic Agent - OD - Right Eye    bevacizumab (AVASTIN) SOSY 2.5 mg    2. 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 Severe nonproliferative diabetic retinopathy of left eye without macular edema associated with type 2 diabetes mellitus ([place])  L87.5643       1.  OD improved CSME overall, 1 month post recent injection Avastin and improved dramatically since onset of therapy.  We will repeat injection today and maintain 1 week follow-up  2.  OS clinical and OCT evaluation confirms CSME has not worsened in fact is improved we will continue to monitor and observe  3.  May need fluorescein angiography next visit in order to not establish the retinal peripheral  nonperfusion status  Ophthalmic Meds Ordered this visit:  Meds ordered this encounter  Medications   bevacizumab (AVASTIN) SOSY 2.5 mg       Return in about 1 month (around 08/17/2021) for DILATE OU, AVASTIN OCT, OD, OPTOS FFA R/L, COLOR  FP.  There are no Patient Instructions on file for this visit.   Explained the diagnoses, plan, and follow up with the patient and they expressed understanding.  Patient expressed understanding of the importance of proper follow up care.   Clent Demark Toriano Aikey M.D. Diseases & Surgery of the Retina and Vitreous Retina & Diabetic [place] 07/17/21     Abbreviations: M myopia (nearsighted); A astigmatism; H hyperopia (farsighted); P presbyopia; Mrx spectacle prescription;  CTL contact lenses; OD right eye; OS left eye; OU both eyes  XT exotropia; ET esotropia; PEK punctate epithelial keratitis; PEE punctate epithelial erosions; DES dry eye syndrome; MGD meibomian gland dysfunction; ATs artificial tears; PFAT's preservative free artificial tears; [place] nuclear sclerotic cataract; PSC posterior subcapsular cataract; ERM epi-retinal membrane; PVD posterior vitreous detachment; RD retinal detachment; DM diabetes mellitus; DR diabetic retinopathy; NPDR non-proliferative diabetic retinopathy; PDR proliferative diabetic retinopathy; CSME clinically significant macular edema; DME diabetic macular edema; dbh dot blot hemorrhages; CWS cotton wool spot; POAG primary open angle glaucoma; C/D cup-to-disc ratio; HVF humphrey visual field; GVF goldmann visual field; OCT optical coherence tomography; IOP intraocular pressure; BRVO Branch retinal vein occlusion; CRVO central retinal vein occlusion; CRAO central retinal artery occlusion; BRAO branch retinal artery occlusion; RT retinal tear; SB scleral buckle; PPV pars plana vitrectomy; VH Vitreous hemorrhage; PRP panretinal laser photocoagulation; IVK intravitreal kenalog; VMT vitreomacular traction; MH Macular hole;  NVD neovascularization of the disc; NVE neovascularization elsewhere; AREDS age related eye disease study; ARMD age related macular degeneration; POAG primary open angle glaucoma; EBMD epithelial/anterior basement membrane dystrophy; ACIOL anterior chamber  intraocular lens; IOL intraocular lens; PCIOL posterior chamber intraocular lens; Phaco/IOL phacoemulsification with intraocular lens placement; [place] photorefractive keratectomy; LASIK laser assisted in situ keratomileusis; HTN hypertension; DM diabetes mellitus; COPD chronic obstructive pulmonary disease

## 2021-07-17 NOTE — Assessment & Plan Note (Signed)
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 Treatment options were outlined including medical therapy, laser & vitrectomy. The use of injectable medications reviewed, including Avastin, Lucentis, and Eylea. Periodic injections into the eye are likely to resolve diabetic macular edema (swelling in the center of vision). Initially, injections are delivered are delivered every 4-6 weeks, and the interval extended as the condition improves. On average, 8-9 injections the first year, and 5 in year 2. Improvement in the condition most often improves on medical therapy. Occasional use of focal laser is also recommended for residual macular edema (swelling). Excellent control of blood glucose and blood pressure are encouraged under the care of a primary physician or endocrinologist. Similarly, attempts to maintain serum cholesterol, low density lipoproteins, and high-density lipoproteins in a favorable range were recommended.  OD still active yet improving CSME center involved.  We will continue to monitor role of VMT

## 2021-07-17 NOTE — Assessment & Plan Note (Signed)
OD may have some role on persistent thickening and CSME

## 2021-07-17 NOTE — Telephone Encounter (Signed)
[name] came in to ask for the Glucometer Rx to be sent.   She also reported that the insurance approved to cover cost of a nurse to visit patient to check on vitals. She now need a referral or to be connected with program that will send a nurse to visit Brandon Sullivan

## 2021-07-19 ENCOUNTER — Other Ambulatory Visit: Payer: Self-pay | Admitting: Internal Medicine

## 2021-07-19 DIAGNOSIS — E119 Type 2 diabetes mellitus without complications: Secondary | ICD-10-CM

## 2021-07-19 MED ORDER — BLOOD GLUCOSE METER KIT: PACK | 0 refills | Status: AC

## 2021-07-19 NOTE — Telephone Encounter (Signed)
Please call Kay--see who the contact with setting this up is--Is it CAP with Medicaid? Also, would she be interested in PACE?  They could probably get him to the specialist work ups I have been trying to get him to.   He would become their physician's patient, however.

## 2021-07-24 NOTE — Telephone Encounter (Signed)
Voicemail left to call back 

## 2021-08-01 NOTE — Telephone Encounter (Signed)
Voicemail left to call back 

## 2021-08-03 NOTE — Telephone Encounter (Signed)
Brandon Sullivan reported that the approval for a nurse to check on him was through [place]. Would not like to participate in PACE since they would have to pay which they cannot do

## 2021-08-07 ENCOUNTER — Ambulatory Visit: Payer: Medicare HMO | Admitting: Internal Medicine

## 2021-08-07 ENCOUNTER — Encounter: Payer: Self-pay | Admitting: Internal Medicine

## 2021-08-07 ENCOUNTER — Other Ambulatory Visit: Payer: Self-pay

## 2021-08-07 VITALS — BP 130/72 | HR 76 | Resp 16 | Ht 64.5 in | Wt 156.5 lb

## 2021-08-07 DIAGNOSIS — I10 Essential (primary) hypertension: Secondary | ICD-10-CM

## 2021-08-07 DIAGNOSIS — N189 Chronic kidney disease, unspecified: Secondary | ICD-10-CM

## 2021-08-07 DIAGNOSIS — E1136 Type 2 diabetes mellitus with diabetic cataract: Secondary | ICD-10-CM

## 2021-08-07 DIAGNOSIS — Z23 Encounter for immunization: Secondary | ICD-10-CM

## 2021-08-07 DIAGNOSIS — E7800 Pure hypercholesterolemia, unspecified: Secondary | ICD-10-CM

## 2021-08-07 LAB — GLUCOSE, POCT (MANUAL RESULT ENTRY): POC Glucose: 141 mg/dl — AB (ref 70–99)

## 2021-08-07 NOTE — Progress Notes (Unsigned)
    Subjective:    Patient ID: Brandon Sullivan, male   DOB: [birth year], 70 y.o.   MRN: [id number]   HPI  Here again without his sister, [name], here as she requested the visit.  Would like for him to get set up with home health care reportedly through [facility], but she has not provided any paperwork.    Lives by himself in a house.  No neighbors check in on him.   States [name] checks in a couple times monthly.  He receives home delivery of medications from [facility] in weekly bubble packs, but sounds like he still forgets to take medications.  Discussed with Mr. Frasier the possibility of PACE application.  He states he thinks that would be okay.  [name] has turned this down in recent past as she states they would have to pay something.  Not clear this is the caase.  Underwent Medicare wellness exam in August with another office.  Not clear why.   DM:  not checking sugars.  Did not pick up glucometer Rx and supplies written last month.  A1C in June was 5.9%.  He does have diabetic retinopathy with macular edema of right eye.  Had injection of right eye with Avastin this month with Dr. Luciana Axe. Remains on Glipizide and Metformin.  2.  Hyperlipidemia:  was at goal just under a year ago    3.  Hypertension:  controlled with Losartan.     4.  CKD:  stable Crea. In August.   5.  Elevated PSA:  [name] was to make Urology appt on her own.  Mr. Prajapati is not certain as to whether he has been seen there yet.     6.  Anemia:  resolved with recent CBC:  was to have a diagnostic colonoscopy, but never answered GI calls.  [name] was trying to set up.  7.  History of alcohol abuse.  States drinking 2 to 3 forty oz malt liquor weekly.  He is driving.    8.  HM:  has not had shingrix, COVID bivalent booster, nor influenza vaccine this year.  Current Meds  Medication Sig   aspirin EC 81 MG tablet Take 1 tablet (81 mg total) by mouth daily. Swallow whole.   atorvastatin (LIPITOR) 20 MG tablet Take 1  tablet (20 mg total) by mouth daily.   gabapentin (NEURONTIN) 100 MG capsule Take 1 capsule (100 mg total) by mouth at bedtime.   glipiZIDE (GLUCOTROL) 10 MG tablet 1 tab by mouth twice daily with meals   losartan (COZAAR) 50 MG tablet 1 tab by mouth daily in the morning with meal   metFORMIN (GLUCOPHAGE) 1000 MG tablet Take 1 tablet (1,000 mg total) by mouth 2 (two) times daily with a meal.   metoprolol tartrate (LOPRESSOR) 25 MG tablet Take 1 tablet (25 mg total) by mouth 2 (two) times daily.   No Known Allergies   Review of Systems    Objective:   BP 130/72 (BP Location: Left Arm, Patient Position: Sitting, Cuff Size: Normal)   Pulse 76   Resp 16   Ht 5' 4.5" (1.638 m)   Wt 156 lb 8 oz (71 kg)   BMI 26.45 kg/m   Physical Exam   Assessment & Plan

## 2021-08-07 NOTE — Telephone Encounter (Signed)
Pt had a visit with Dr Delrae Alfred on 08/07/21

## 2021-08-08 LAB — COMPREHENSIVE METABOLIC PANEL
ALT: 12 IU/L (ref 0–44)
AST: 18 IU/L (ref 0–40)
Albumin/Globulin Ratio: 1.5 (ref 1.2–2.2)
Albumin: 4.4 g/dL (ref 3.8–4.8)
Alkaline Phosphatase: 117 IU/L (ref 44–121)
BUN/Creatinine Ratio: 12 (ref 10–24)
BUN: 22 mg/dL (ref 8–27)
Bilirubin Total: 0.3 mg/dL (ref 0.0–1.2)
CO2: 24 mmol/L (ref 20–29)
Calcium: 9.4 mg/dL (ref 8.6–10.2)
Chloride: 110 mmol/L — ABNORMAL HIGH (ref 96–106)
Creatinine, Ser: 1.77 mg/dL — ABNORMAL HIGH (ref 0.76–1.27)
Globulin, Total: 2.9 g/dL (ref 1.5–4.5)
Glucose: 116 mg/dL — ABNORMAL HIGH (ref 70–99)
Potassium: 4.9 mmol/L (ref 3.5–5.2)
Sodium: 146 mmol/L — ABNORMAL HIGH (ref 134–144)
Total Protein: 7.3 g/dL (ref 6.0–8.5)
eGFR: 41 mL/min/{1.73_m2} — ABNORMAL LOW (ref >59)

## 2021-08-08 LAB — LIPID PANEL W/O CHOL/HDL RATIO
Cholesterol, Total: 141 mg/dL (ref 100–199)
HDL: 50 mg/dL (ref >39)
LDL Chol Calc (NIH): 71 mg/dL (ref 0–99)
Triglycerides: 112 mg/dL (ref 0–149)
VLDL Cholesterol Cal: 20 mg/dL (ref 5–40)

## 2021-08-08 LAB — HGB A1C W/O EAG: Hgb A1c MFr Bld: 6.2 % — ABNORMAL HIGH (ref 4.8–5.6)

## 2021-08-11 ENCOUNTER — Other Ambulatory Visit: Payer: Self-pay

## 2021-08-11 ENCOUNTER — Ambulatory Visit (INDEPENDENT_AMBULATORY_CARE_PROVIDER_SITE_OTHER): Payer: Medicare HMO | Admitting: Podiatry

## 2021-08-11 ENCOUNTER — Encounter: Payer: Self-pay | Admitting: Podiatry

## 2021-08-11 DIAGNOSIS — M79674 Pain in right toe(s): Secondary | ICD-10-CM | POA: Diagnosis not present

## 2021-08-11 DIAGNOSIS — B351 Tinea unguium: Secondary | ICD-10-CM | POA: Diagnosis not present

## 2021-08-11 DIAGNOSIS — M79675 Pain in left toe(s): Secondary | ICD-10-CM

## 2021-08-11 DIAGNOSIS — E1142 Type 2 diabetes mellitus with diabetic polyneuropathy: Secondary | ICD-10-CM | POA: Diagnosis not present

## 2021-08-11 NOTE — Progress Notes (Signed)
This patient returns to my office for at risk foot care.  This patient requires this care by a professional since this patient will be at risk due to having diabetes.  This patient is unable to cut nails himself since the patient cannot reach his nails.These nails are painful walking and wearing shoes.  This patient presents for at risk foot care today.  General Appearance  Alert, conversant and in no acute stress.  Vascular  Dorsalis pedis and posterior tibial  pulses are palpable  bilaterally.  Capillary return is within normal limits  bilaterally. Temperature is within normal limits  bilaterally.  Neurologic  Senn-Weinstein monofilament wire test  right diminished.  LOPS left  WNL.Marland Kitchen Muscle power within normal limits bilaterally.  Nails Thick disfigured discolored nails with subungual debris  from hallux to fifth toes bilaterally. No evidence of bacterial infection or drainage bilaterally.  Orthopedic  No limitations of motion  feet .  No crepitus or effusions noted.  No bony pathology .  Hammer toes second left foot.  Skin  normotropic skin with no porokeratosis noted bilaterally.  No signs of infections or ulcers noted.     Onychomycosis  Pain in right toes  Pain in left toes  Consent was obtained for treatment procedures.   Mechanical debridement of nails 1-5  bilaterally performed with a nail nipper.  Filed with dremel without incident.    Return office visit   4 months                   Told patient to return for periodic foot care and evaluation due to potential at risk complications.   Helane Gunther DPM

## 2021-08-14 ENCOUNTER — Other Ambulatory Visit: Payer: Self-pay | Admitting: Internal Medicine

## 2021-08-17 ENCOUNTER — Encounter (INDEPENDENT_AMBULATORY_CARE_PROVIDER_SITE_OTHER): Payer: Medicare HMO | Admitting: Ophthalmology

## 2021-08-28 ENCOUNTER — Encounter (INDEPENDENT_AMBULATORY_CARE_PROVIDER_SITE_OTHER): Payer: Medicare HMO | Admitting: Ophthalmology

## 2021-08-28 ENCOUNTER — Ambulatory Visit (INDEPENDENT_AMBULATORY_CARE_PROVIDER_SITE_OTHER): Payer: Medicare HMO | Admitting: Ophthalmology

## 2021-08-28 ENCOUNTER — Encounter (INDEPENDENT_AMBULATORY_CARE_PROVIDER_SITE_OTHER): Payer: Self-pay | Admitting: Ophthalmology

## 2021-08-28 ENCOUNTER — Other Ambulatory Visit: Payer: Self-pay

## 2021-08-28 DIAGNOSIS — E113492 Type 2 diabetes mellitus with severe nonproliferative diabetic retinopathy without macular edema, left eye: Secondary | ICD-10-CM | POA: Diagnosis not present

## 2021-08-28 DIAGNOSIS — E113411 Type 2 diabetes mellitus with severe nonproliferative diabetic retinopathy with macular edema, right eye: Secondary | ICD-10-CM | POA: Diagnosis not present

## 2021-08-28 DIAGNOSIS — H43821 Vitreomacular adhesion, right eye: Secondary | ICD-10-CM

## 2021-08-28 MED ORDER — FLUORESCEIN SODIUM 10 % IV SOLN
500.0000 mg | INTRAVENOUS | Status: AC | PRN
  Administered 2021-08-28: 16:00:00 500 mg via INTRAVENOUS

## 2021-08-28 MED ORDER — BEVACIZUMAB 2.5 MG/0.1ML IZ SOSY
2.5000 mg | PREFILLED_SYRINGE | INTRAVITREAL | Status: AC | PRN
  Administered 2021-08-28: 16:00:00 2.5 mg via INTRAVITREAL

## 2021-08-28 NOTE — Assessment & Plan Note (Signed)
No sign of diabetic macular edema left eye will continue to observe OS

## 2021-08-28 NOTE — Assessment & Plan Note (Signed)
OD with CSME focally, slightly improved and overall improved over the last 4 months.  Prominent microaneurysm large documented today on fundus fluorescein angiography, will deliver intravitreal Avastin OD today and and return in 1 to 2 weeks for possible focal laser treatment to this region to decrease treatment burden.  If no improvement over the coming months may require vitrectomy for vitreal macular traction

## 2021-08-28 NOTE — Progress Notes (Signed)
08/28/2021     CHIEF COMPLAINT Patient presents for  Chief Complaint  Patient presents with   Retina Follow Up      HISTORY OF PRESENT ILLNESS: Brandon Sullivan is a 70 y.o. male who presents to the clinic today for:   HPI     Retina Follow Up   Patient presents with  Diabetic Retinopathy.  In right eye.  This started 6 weeks ago.  Severity is mild.  Duration of 6 weeks.  Since onset it is stable.        Comments   6 week fu fp/ffa r/l oct and avastin OD Pt states VA OU stable since last visit. Pt denies FOL, floaters, or ocular pain OU.  Pt states, "I cannot tell that anything is different with my vision." A1C: Unknown LBS: Does not check       Last edited by Kendra Opitz, COA on 08/28/2021  3:34 PM.      Referring physician: Mack Hook, MD [place],  [place] [id number]  HISTORICAL INFORMATION:   Selected notes from the MEDICAL RECORD NUMBER    Lab Results  Component Value Date   HGBA1C 6.2 (H) 08/07/2021     CURRENT MEDICATIONS: No current outpatient medications on file. (Ophthalmic Drugs)   No current facility-administered medications for this visit. (Ophthalmic Drugs)   Current Outpatient Medications (Other)  Medication Sig   ASPIRIN LOW DOSE 81 MG EC tablet TAKE 1 TABLET BY MOUTH EVERY DAY   atorvastatin (LIPITOR) 20 MG tablet TAKE 1 TABLET BY MOUTH EVERY DAY   blood glucose meter kit and supplies Dispense based on patient and insurance preference. Use up to four times daily as directed. (FOR ICD-10 E10.9, E11.9). (Patient not taking: Reported on 08/07/2021)   gabapentin (NEURONTIN) 100 MG capsule TAKE 1 CAPSULE BY MOUTH AT BEDTIME   losartan (COZAAR) 50 MG tablet TAKE 1 TABLET BY MOUTH EVERY MORNING WITH A MEAL   metFORMIN (GLUCOPHAGE) 1000 MG tablet TAKE 1 TABLET BY MOUTH 2 TIMES DAILY WITH A MEAL   metoprolol tartrate (LOPRESSOR) 25 MG tablet TAKE 1 TABLET BY MOUTH 2 TIMES DAILY   OneTouch Delica Lancets 46N MISC USE AS  DIRECTED (Patient not taking: No sig reported)   Tea Tree Oil OIL Mix with [place] Foot cream and apply to feet but not in between toes after bathing (Patient not taking: No sig reported)   terbinafine (LAMISIL) 1 % cream Apply twice daily to feet but not in between toes for at least 14 days or until flaking resolved. (Patient not taking: No sig reported)   No current facility-administered medications for this visit. (Other)      REVIEW OF SYSTEMS:    ALLERGIES No Known Allergies  PAST MEDICAL HISTORY Past Medical History:  Diagnosis Date   Back pain    Diabetes mellitus without complication ([place])    Hypertension    Medical history unknown    Nuclear sclerotic cataract of left eye 07/06/2020   TBI (traumatic brain injury) 12/2018   History reviewed. No pertinent surgical history.  FAMILY HISTORY Family History  Problem Relation Age of Onset   Cancer Mother        Not clear of primary   Diabetes Mother    Hypertension Mother    Hypertension Father    Stroke Father    Hyperlipidemia Father    HIV/AIDS Sister    Cancer Brother        Colon   Arthritis  Sister    Diabetes Brother     SOCIAL HISTORY Social History   Tobacco Use   Smoking status: Never   Smokeless tobacco: Never  Substance Use Topics   Alcohol use: Not Currently    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  Restarted and drinking 2 40 oz per week.   Drug use: No         OPHTHALMIC EXAM:  Base Eye Exam     Visual Acuity (ETDRS)       Right Left   Dist [place] 20/200 20/25 -1   Dist ph [place] NI          Tonometry (Tonopen, 3:40 PM)       Right Left   Pressure 21 21         Pupils       Pupils Dark Light Shape React APD   Right PERRL 4 3 Round Brisk None   Left PERRL 4 3 Round Brisk None         Visual Fields (Counting fingers)       Left Right    Full Full         Extraocular Movement       Right Left    Full, Ortho Full, Ortho          Neuro/Psych     Oriented x3: Yes   Mood/Affect: Normal         Dilation     Both eyes: 1.0% Mydriacyl, 2.5% Phenylephrine @ 3:40 PM           Slit Lamp and Fundus Exam     External Exam       Right Left   External Normal Normal         Slit Lamp Exam       Right Left   Lids/Lashes Normal Normal   Conjunctiva/Sclera White and quiet White and quiet   Cornea Clear Clear   Anterior Chamber Deep and quiet Deep and quiet   Iris Round and reactive Round and reactive   Lens Centered posterior chamber intraocular lens 3+ Nuclear sclerosis   Anterior Vitreous Normal Normal         Fundus Exam       Right Left   Posterior Vitreous Normal, no posterior vitreous detachment Normal, no posterior vitreous detachment   Disc Normal Normal   C/D Ratio 0.35 0.35   Macula Severe clinically significant macular edema no clinically significant macular edema, no drusen, no exudates, no macular thickening   Vessels NPDR-Severe NPDR-Severe   Periphery Normal Normal            IMAGING AND PROCEDURES  Imaging and Procedures for 08/28/21  Intravitreal Injection, Pharmacologic Agent - OD - Right Eye       Time Out 08/28/2021. 4:15 PM. Confirmed correct patient, procedure, site, and patient consented.   Anesthesia Topical anesthesia was used. Anesthetic medications included Lidocaine 4%.   Procedure Preparation included Tobramycin 0.3%, 10% betadine to eyelids, 5% betadine to ocular surface. A 30 gauge needle was used.   Injection: 2.5 mg bevacizumab 2.5 MG/0.1ML   Route: Intravitreal, Site: Right Eye   NDC: [phone], Lot: [id number]   Post-op Post injection exam found visual acuity of at least counting fingers. The patient tolerated the procedure well. There were no complications. The patient received written and verbal post procedure care education. Post injection medications included ocuflox.      OCT, Retina - OU -  Both Eyes       Right Eye Quality was good.  Scan locations included subfoveal. Central Foveal Thickness: 385. Progression has worsened. Findings include cystoid macular edema, vitreomacular adhesion , vitreous traction.   Left Eye Quality was good. Scan locations included subfoveal. Central Foveal Thickness: 278. Progression has been stable. Findings include cystoid macular edema.   Notes Massive CSME OD, from severe NPDR.  Improving CSME however as compared to the last 4 months, will recommend injection intravitreal Avastin today  OS with very minor retinal thickening and in fact much improved from December 2020 as well as improved from last visit September 2022.  Based upon FFA may consider focal laser treatment in 1 to 2 weeks   I will recommend observe the left eye at this time.     Color Fundus Photography Optos - OU - Both Eyes       Right Eye Progression has no prior data. Disc findings include normal observations. Macula : microaneurysms, edema, exudates. Periphery : normal observations.   Left Eye Progression has no prior data. Disc findings include normal observations. Macula : microaneurysms. Periphery : normal observations.   Notes OU with severe NPDR, CSME with exudates noted OD     Fluorescein Angiography Optos (Transit OD)       Injection: 500 mg Fluorescein Sodium 10 %   Route: Intravenous   NDC: 6948-5462-70   Right Eye Early phase findings include microaneurysm, leakage. Mid/Late phase findings include microaneurysm, leakage. Choroidal neovascularization is not present.   Left Eye   Progression has no prior data. Mid/Late phase findings include microaneurysm. Choroidal neovascularization is not present.   Notes CSME OD, with prominent microaneurysms temporally and at region of area of CSME temporal to [place].  OS no CSME  OU with moderate to severe NPDR             ASSESSMENT/PLAN:  Severe nonproliferative diabetic retinopathy of right eye, with macular edema, associated with type 2  diabetes mellitus ([place]) OD with CSME focally, slightly improved and overall improved over the last 4 months.  Prominent microaneurysm large documented today on fundus fluorescein angiography, will deliver intravitreal Avastin OD today and and return in 1 to 2 weeks for possible focal laser treatment to this region to decrease treatment burden.  If no improvement over the coming months may require vitrectomy for vitreal macular traction  Vitreomacular traction, right May be playing some component of macular disease will attempt focal laser treatment and if no improvement may need vitrectomy OD  Severe nonproliferative diabetic retinopathy of left eye (HCC) No sign of diabetic macular edema left eye will continue to observe OS     ICD-10-CM   1. Severe nonproliferative diabetic retinopathy of right eye, with macular edema, associated with type 2 diabetes mellitus (HCC)  E11.3411 Intravitreal Injection, Pharmacologic Agent - OD - Right Eye    OCT, Retina - OU - Both Eyes    Color Fundus Photography Optos - OU - Both Eyes    Fluorescein Angiography Optos (Transit OD)    Fluorescein Sodium 10 % injection 500 mg    bevacizumab (AVASTIN) SOSY 2.5 mg    2. 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 Severe nonproliferative diabetic retinopathy of left eye without macular edema associated with type 2 diabetes mellitus ([place])  J50.0938       1.  2.  3.  Ophthalmic Meds Ordered this visit:  Meds ordered this encounter  Medications   Fluorescein Sodium 10 %  injection 500 mg   bevacizumab (AVASTIN) SOSY 2.5 mg       Return in about 2 weeks (around 09/11/2021) for OD, FOCAL, OCT.  There are no Patient Instructions on file for this visit.   Explained the diagnoses, plan, and follow up with the patient and they expressed understanding.  Patient expressed understanding of the importance of proper follow up care.   Clent Demark Renesmee Raine M.D. Diseases & Surgery of the Retina and Vitreous Retina  & Diabetic [place] 08/28/21     Abbreviations: M myopia (nearsighted); A astigmatism; H hyperopia (farsighted); P presbyopia; Mrx spectacle prescription;  CTL contact lenses; OD right eye; OS left eye; OU both eyes  XT exotropia; ET esotropia; PEK punctate epithelial keratitis; PEE punctate epithelial erosions; DES dry eye syndrome; MGD meibomian gland dysfunction; ATs artificial tears; PFAT's preservative free artificial tears; [place] nuclear sclerotic cataract; PSC posterior subcapsular cataract; ERM epi-retinal membrane; PVD posterior vitreous detachment; RD retinal detachment; DM diabetes mellitus; DR diabetic retinopathy; NPDR non-proliferative diabetic retinopathy; PDR proliferative diabetic retinopathy; CSME clinically significant macular edema; DME diabetic macular edema; dbh dot blot hemorrhages; CWS cotton wool spot; POAG primary open angle glaucoma; C/D cup-to-disc ratio; HVF humphrey visual field; GVF goldmann visual field; OCT optical coherence tomography; IOP intraocular pressure; BRVO Branch retinal vein occlusion; CRVO central retinal vein occlusion; CRAO central retinal artery occlusion; BRAO branch retinal artery occlusion; RT retinal tear; SB scleral buckle; PPV pars plana vitrectomy; VH Vitreous hemorrhage; PRP panretinal laser photocoagulation; IVK intravitreal kenalog; VMT vitreomacular traction; MH Macular hole;  NVD neovascularization of the disc; NVE neovascularization elsewhere; AREDS age related eye disease study; ARMD age related macular degeneration; POAG primary open angle glaucoma; EBMD epithelial/anterior basement membrane dystrophy; ACIOL anterior chamber intraocular lens; IOL intraocular lens; PCIOL posterior chamber intraocular lens; Phaco/IOL phacoemulsification with intraocular lens placement; [place] photorefractive keratectomy; LASIK laser assisted in situ keratomileusis; HTN hypertension; DM diabetes mellitus; COPD chronic obstructive pulmonary disease

## 2021-08-28 NOTE — Assessment & Plan Note (Signed)
May be playing some component of macular disease will attempt focal laser treatment and if no improvement may need vitrectomy OD

## 2021-09-11 ENCOUNTER — Ambulatory Visit (INDEPENDENT_AMBULATORY_CARE_PROVIDER_SITE_OTHER): Payer: Medicare HMO | Admitting: Ophthalmology

## 2021-09-11 ENCOUNTER — Encounter (INDEPENDENT_AMBULATORY_CARE_PROVIDER_SITE_OTHER): Payer: Self-pay | Admitting: Ophthalmology

## 2021-09-11 ENCOUNTER — Other Ambulatory Visit: Payer: Self-pay

## 2021-09-11 DIAGNOSIS — E113411 Type 2 diabetes mellitus with severe nonproliferative diabetic retinopathy with macular edema, right eye: Secondary | ICD-10-CM | POA: Diagnosis not present

## 2021-09-11 DIAGNOSIS — H43821 Vitreomacular adhesion, right eye: Secondary | ICD-10-CM

## 2021-09-11 NOTE — Progress Notes (Signed)
09/11/2021     CHIEF COMPLAINT Patient presents for  Chief Complaint  Patient presents with   Retina Follow Up      HISTORY OF PRESENT ILLNESS: Brandon Sullivan is a 70 y.o. male who presents to the clinic today for:   HPI     Retina Follow Up           Diagnosis: Diabetic Retinopathy   Laterality: right eye   Onset: 2 weeks ago   Severity: mild   Duration: 2 weeks   Course: stable         Comments   2 weeks fu OD, Focal, OCT. Patient states vision is stable and unchanged since last visit. Denies any new floaters or FOL.       Last edited by Laurin Coder on 09/11/2021  2:46 PM.      Referring physician: Mack Hook, MD [place],  [place] [address]  HISTORICAL INFORMATION:   Selected notes from the MEDICAL RECORD NUMBER    Lab Results  Component Value Date   HGBA1C 6.2 (H) 08/07/2021     CURRENT MEDICATIONS: No current outpatient medications on file. (Ophthalmic Drugs)   No current facility-administered medications for this visit. (Ophthalmic Drugs)   Current Outpatient Medications (Other)  Medication Sig   ASPIRIN LOW DOSE 81 MG EC tablet TAKE 1 TABLET BY MOUTH EVERY DAY   atorvastatin (LIPITOR) 20 MG tablet TAKE 1 TABLET BY MOUTH EVERY DAY   blood glucose meter kit and supplies Dispense based on patient and insurance preference. Use up to four times daily as directed. (FOR ICD-10 E10.9, E11.9). (Patient not taking: Reported on 08/07/2021)   gabapentin (NEURONTIN) 100 MG capsule TAKE 1 CAPSULE BY MOUTH AT BEDTIME   losartan (COZAAR) 50 MG tablet TAKE 1 TABLET BY MOUTH EVERY MORNING WITH A MEAL   metFORMIN (GLUCOPHAGE) 1000 MG tablet TAKE 1 TABLET BY MOUTH 2 TIMES DAILY WITH A MEAL   metoprolol tartrate (LOPRESSOR) 25 MG tablet TAKE 1 TABLET BY MOUTH 2 TIMES DAILY   OneTouch Delica Lancets 62B MISC USE AS DIRECTED (Patient not taking: No sig reported)   Tea Tree Oil OIL Mix with Gold Bond Foot cream and apply to feet but not in  between toes after bathing (Patient not taking: No sig reported)   terbinafine (LAMISIL) 1 % cream Apply twice daily to feet but not in between toes for at least 14 days or until flaking resolved. (Patient not taking: No sig reported)   No current facility-administered medications for this visit. (Other)      REVIEW OF SYSTEMS:    ALLERGIES No Known Allergies  PAST MEDICAL HISTORY Past Medical History:  Diagnosis Date   Back pain    Diabetes mellitus without complication ([place])    Hypertension    Medical history unknown    Nuclear sclerotic cataract of left eye 07/06/2020   TBI (traumatic brain injury) 12/2018   History reviewed. No pertinent surgical history.  FAMILY HISTORY Family History  Problem Relation Age of Onset   Cancer Mother        Not clear of primary   Diabetes Mother    Hypertension Mother    Hypertension Father    Stroke Father    Hyperlipidemia Father    HIV/AIDS Sister    Cancer Brother        Colon   Arthritis Sister    Diabetes Brother     SOCIAL HISTORY Social History   Tobacco Use  Smoking status: Never   Smokeless tobacco: Never  Substance Use Topics   Alcohol use: Not Currently    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  Restarted and drinking 2 40 oz per week.   Drug use: No         OPHTHALMIC EXAM:  Base Eye Exam     Visual Acuity (ETDRS)       Right Left   Dist [place] 20/100 20/20 -1   Dist ph Standard NI          Tonometry (Tonopen, 2:52 PM)       Right Left   Pressure 28 27  Pt squeezing eyes closed, pulling away, difficulty not holding breath.        Pupils       Pupils Dark Light APD   Right PERRL 4 3 None   Left PERRL 4 3 None         Extraocular Movement       Right Left    Full Full         Neuro/Psych     Oriented x3: Yes   Mood/Affect: Normal         Dilation     Right eye: 1.0% Mydriacyl, 2.5% Phenylephrine @ 2:52 PM           Slit Lamp and Fundus Exam      External Exam       Right Left   External Normal Normal         Slit Lamp Exam       Right Left   Lids/Lashes Normal Normal   Conjunctiva/Sclera White and quiet White and quiet   Cornea Clear Clear   Anterior Chamber Deep and quiet Deep and quiet   Iris Round and reactive Round and reactive   Lens Centered posterior chamber intraocular lens 3+ Nuclear sclerosis   Anterior Vitreous Normal Normal         Fundus Exam       Right Left   Posterior Vitreous Normal, no posterior vitreous detachment    Disc Normal    C/D Ratio 0.35    Macula Severe clinically significant macular edema    Vessels NPDR-Severe    Periphery Normal             IMAGING AND PROCEDURES  Imaging and Procedures for 09/11/21  Focal Laser - OD - Right Eye       Time Out Confirmed correct patient, procedure, site, and patient consented.   Anesthesia Topical anesthesia was used. Anesthetic medications included Proparacaine 0.5%.   Laser Information The type of laser was diode. Color was yellow. The duration in seconds was 0.08. The spot size was 100 microns. Laser power was 60. Total spots was 44.   Post-op The patient tolerated the procedure well. There were no complications. The patient received written and verbal post procedure care education.      OCT, Retina - OU - Both Eyes       Right Eye Quality was good. Scan locations included subfoveal. Central Foveal Thickness: 365. Progression has worsened. Findings include cystoid macular edema, vitreomacular adhesion , vitreous traction.   Left Eye Quality was good. Scan locations included subfoveal. Central Foveal Thickness: 278. Progression has been stable. Findings include cystoid macular edema.   Notes Massive CSME OD, from severe NPDR IMproved CSME OD WITH RESIDUAL THICKENING TEMPORALLY   I will recommend observe the left eye at this time.  ASSESSMENT/PLAN:  Severe nonproliferative diabetic retinopathy of  right eye, with macular edema, associated with type 2 diabetes mellitus ([place]) Continue with residual CSME will deliver focal laser treatment temporally today     ICD-10-CM   1. Severe nonproliferative diabetic retinopathy of right eye, with macular edema, associated with type 2 diabetes mellitus ([facility])  X72.6203 Focal Laser - OD - Right Eye    OCT, Retina - OU - Both Eyes    2. Vitreomacular traction, right  H43.821 OCT, Retina - OU - Both Eyes      1.  OD, CSME residual, focal laser today to decrease treatment burden.  2.  Dilate OU next in 4 months  3.  Ophthalmic Meds Ordered this visit:  No orders of the defined types were placed in this encounter.      Return in about 4 months (around 01/10/2022) for DILATE OU, COLOR FP, OCT.  There are no Patient Instructions on file for this visit.   Explained the diagnoses, plan, and follow up with the patient and they expressed understanding.  Patient expressed understanding of the importance of proper follow up care.   Clent Demark Anav Lammert M.D. Diseases & Surgery of the Retina and Vitreous [facility]ina [facility] Diabetic [place] 09/11/21     Abbreviations: M myopia (nearsighted); A astigmatism; H hyperopia (farsighted); P presbyopia; Mrx spectacle prescription;  CTL contact lenses; OD right eye; OS left eye; OU both eyes  XT exotropia; ET esotropia; PEK punctate epithelial keratitis; PEE punctate epithelial erosions; DES dry eye syndrome; MGD meibomian gland dysfunction; ATs artificial tears; PFAT's preservative free artificial tears; [place] nuclear sclerotic cataract; PSC posterior subcapsular cataract; ERM epi-retinal membrane; PVD posterior vitreous detachment; RD retinal detachment; DM diabetes mellitus; DR diabetic retinopathy; NPDR non-proliferative diabetic retinopathy; PDR proliferative diabetic retinopathy; CSME clinically significant macular edema; DME diabetic macular edema; dbh dot blot hemorrhages; CWS cotton wool spot; POAG primary open angle  glaucoma; C/D cup-to-disc ratio; HVF humphrey visual field; GVF goldmann visual field; OCT optical coherence tomography; IOP intraocular pressure; BRVO Branch retinal vein occlusion; CRVO central retinal vein occlusion; CRAO central retinal artery occlusion; BRAO branch retinal artery occlusion; RT retinal tear; SB scleral buckle; PPV pars plana vitrectomy; VH Vitreous hemorrhage; PRP panretinal laser photocoagulation; IVK intravitreal kenalog; VMT vitreomacular traction; MH Macular hole;  NVD neovascularization of the disc; NVE neovascularization elsewhere; AREDS age related eye disease study; ARMD age related macular degeneration; POAG primary open angle glaucoma; EBMD epithelial/anterior basement membrane dystrophy; ACIOL anterior chamber intraocular lens; IOL intraocular lens; PCIOL posterior chamber intraocular lens; Phaco/IOL phacoemulsification with intraocular lens placement; [place] photorefractive keratectomy; LASIK laser assisted in situ keratomileusis; HTN hypertension; DM diabetes mellitus; COPD chronic obstructive pulmonary disease

## 2021-09-11 NOTE — Assessment & Plan Note (Signed)
Continue with residual CSME will deliver focal laser treatment temporally today

## 2021-09-12 ENCOUNTER — Encounter (INDEPENDENT_AMBULATORY_CARE_PROVIDER_SITE_OTHER): Payer: Medicare HMO | Admitting: Ophthalmology

## 2021-11-06 ENCOUNTER — Other Ambulatory Visit: Payer: Medicare HMO

## 2021-11-07 ENCOUNTER — Ambulatory Visit: Payer: Medicare HMO | Admitting: Internal Medicine

## 2021-11-07 ENCOUNTER — Other Ambulatory Visit (INDEPENDENT_AMBULATORY_CARE_PROVIDER_SITE_OTHER): Payer: Medicare HMO

## 2021-11-07 ENCOUNTER — Other Ambulatory Visit: Payer: Self-pay

## 2021-11-07 DIAGNOSIS — E1136 Type 2 diabetes mellitus with diabetic cataract: Secondary | ICD-10-CM

## 2021-11-08 LAB — HEMOGLOBIN A1C
Est. average glucose Bld gHb Est-mCnc: 186 mg/dL
Hgb A1c MFr Bld: 8.1 % — ABNORMAL HIGH (ref 4.8–5.6)

## 2021-11-10 ENCOUNTER — Encounter: Payer: Self-pay | Admitting: Internal Medicine

## 2021-11-10 ENCOUNTER — Ambulatory Visit: Payer: Medicare HMO | Admitting: Internal Medicine

## 2021-11-10 ENCOUNTER — Other Ambulatory Visit: Payer: Self-pay

## 2021-11-10 VITALS — BP 140/70 | HR 92 | Resp 16 | Ht 64.5 in | Wt 163.0 lb

## 2021-11-10 DIAGNOSIS — E7800 Pure hypercholesterolemia, unspecified: Secondary | ICD-10-CM

## 2021-11-10 DIAGNOSIS — I10 Essential (primary) hypertension: Secondary | ICD-10-CM

## 2021-11-10 DIAGNOSIS — E1136 Type 2 diabetes mellitus with diabetic cataract: Secondary | ICD-10-CM

## 2021-11-10 DIAGNOSIS — Z23 Encounter for immunization: Secondary | ICD-10-CM

## 2021-11-10 NOTE — Progress Notes (Unsigned)
° ° °  Subjective:    Patient ID: Brandon Sullivan, male   DOB: 1950/11/20, 71 y.o.   MRN: [id number]   HPI  Not taking his meds regularly, despite setting up delivery in weekly bubble packs.  States he knows he should take his meds.  Describes being frustrated by needing to take medication.   DM:  A1C has jumped up to 8.1%.  States he has probably been missing his meds a lot.  We had titrated off Glipizide as A1C was remaining in prediabetic range, but now above goal with A1C.  Following with ophthalmology regularly.    2.  Hypertension:  did not take meds today and admits to missing frequently.  Discussed complications of untreated HTN.    3.  Hyperlipidemia:  again, missing meds, but was basically at goal with LDL at 71 in November.    4.  HM:  needs second shingles vaccine.   Current Meds  Medication Sig   ASPIRIN LOW DOSE 81 MG EC tablet TAKE 1 TABLET BY MOUTH EVERY DAY   atorvastatin (LIPITOR) 20 MG tablet TAKE 1 TABLET BY MOUTH EVERY DAY   gabapentin (NEURONTIN) 100 MG capsule TAKE 1 CAPSULE BY MOUTH AT BEDTIME   losartan (COZAAR) 50 MG tablet TAKE 1 TABLET BY MOUTH EVERY MORNING WITH A MEAL (Patient taking differently: TAKE 1 TABLET BY MOUTH EVERY MORNING WITH A MEAL occasionally)   metFORMIN (GLUCOPHAGE) 1000 MG tablet TAKE 1 TABLET BY MOUTH 2 TIMES DAILY WITH A MEAL (Patient taking differently: Occasionally)   metoprolol tartrate (LOPRESSOR) 25 MG tablet TAKE 1 TABLET BY MOUTH 2 TIMES DAILY (Patient taking differently: Occasionally)   No Known Allergies   Review of Systems    Objective:   BP 140/70 (BP Location: Right Arm, Patient Position: Sitting, Cuff Size: Normal)    Pulse 92    Resp 16    Ht 5' 4.5" (1.638 m)    Wt 163 lb (73.9 kg)    BMI 27.55 kg/m   Physical Exam NAD Lungs:  CTA CV:  RRR without murmur or rub.  No carotid bruits.  Carotid, radial and DP pulses normal and equal LE:  No edema.   Assessment & Plan    DM:  not controlled.  Not clear if would be  controlled if taking Metformin regularly.  He will get his

## 2021-11-27 ENCOUNTER — Ambulatory Visit: Payer: Medicare HMO | Admitting: Internal Medicine

## 2021-12-22 ENCOUNTER — Ambulatory Visit: Payer: Medicare HMO | Admitting: Podiatry

## 2022-01-15 ENCOUNTER — Ambulatory Visit (INDEPENDENT_AMBULATORY_CARE_PROVIDER_SITE_OTHER): Payer: Medicare PPO | Admitting: Ophthalmology

## 2022-01-15 ENCOUNTER — Encounter (INDEPENDENT_AMBULATORY_CARE_PROVIDER_SITE_OTHER): Payer: Self-pay | Admitting: Ophthalmology

## 2022-01-15 DIAGNOSIS — H43821 Vitreomacular adhesion, right eye: Secondary | ICD-10-CM

## 2022-01-15 DIAGNOSIS — E113411 Type 2 diabetes mellitus with severe nonproliferative diabetic retinopathy with macular edema, right eye: Secondary | ICD-10-CM

## 2022-01-15 DIAGNOSIS — E113492 Type 2 diabetes mellitus with severe nonproliferative diabetic retinopathy without macular edema, left eye: Secondary | ICD-10-CM

## 2022-01-15 NOTE — Progress Notes (Signed)
? ? ?01/15/2022 ? ?  ? ?CHIEF COMPLAINT ?Patient presents for  ?Chief Complaint  ?Patient presents with  ? Diabetic Retinopathy with Macular Edema  ? ? ? ? ?HISTORY OF PRESENT ILLNESS: ?Brandon Sullivan is a 71 y.o. male who presents to the clinic today for:  ? ?HPI   ?4 mos fu OU Dilate OU, Color FP, OCT. ?Patient states vision is stable and unchanged since last visit. Denies any new floaters or FOL. ?LBS: Patient states "I don't really check it anymore." ?Last edited by Laurin Coder on 01/15/2022  1:34 PM.  ?  ? ? ?Referring physician: ?Mack Hook, MD ?[address] ?[place],  [place] [address] ? ?HISTORICAL INFORMATION:  ? ?Selected notes from the [facility] ?  ? ?Lab Results  ?Component Value Date  ? HGBA1C 8.1 (H) 11/07/2021  ?  ? ?CURRENT MEDICATIONS: ?No current outpatient medications on file. (Ophthalmic Drugs)  ? ?No current facility-administered medications for this visit. (Ophthalmic Drugs)  ? ?Current Outpatient Medications (Other)  ?Medication Sig  ? ASPIRIN LOW DOSE 81 MG EC tablet TAKE 1 TABLET BY MOUTH EVERY DAY  ? atorvastatin (LIPITOR) 20 MG tablet TAKE 1 TABLET BY MOUTH EVERY DAY  ? blood glucose meter kit and supplies Dispense based on patient and insurance preference. Use up to four times daily as directed. (FOR ICD-10 E10.9, E11.9). (Patient not taking: Reported on 08/07/2021)  ? gabapentin (NEURONTIN) 100 MG capsule TAKE 1 CAPSULE BY MOUTH AT BEDTIME  ? losartan (COZAAR) 50 MG tablet TAKE 1 TABLET BY MOUTH EVERY MORNING WITH A MEAL (Patient taking differently: TAKE 1 TABLET BY MOUTH EVERY MORNING WITH A MEAL occasionally)  ? metFORMIN (GLUCOPHAGE) 1000 MG tablet TAKE 1 TABLET BY MOUTH 2 TIMES DAILY WITH A MEAL (Patient taking differently: Occasionally)  ? metoprolol tartrate (LOPRESSOR) 25 MG tablet TAKE 1 TABLET BY MOUTH 2 TIMES DAILY (Patient taking differently: Occasionally)  ? OneTouch Delica Lancets 78I MISC USE AS DIRECTED (Patient not taking: Reported on 03/01/2021)  ? Tea Tree  Oil OIL Mix with Gold Bond Foot cream and apply to feet but not in between toes after bathing (Patient not taking: Reported on 03/01/2021)  ? terbinafine (LAMISIL) 1 % cream Apply twice daily to feet but not in between toes for at least 14 days or until flaking resolved. (Patient not taking: Reported on 03/01/2021)  ? ?No current facility-administered medications for this visit. (Other)  ? ? ? ? ?REVIEW OF SYSTEMS: ?ROS   ?Negative for: Constitutional, Gastrointestinal, Neurological, Skin, Genitourinary, Musculoskeletal, HENT, Endocrine, Cardiovascular, Eyes, Respiratory, Psychiatric, Allergic/Imm, Heme/Lymph ?Last edited by Hurman Horn, MD on 01/15/2022  2:17 PM.  ?  ? ? ? ?ALLERGIES ?No Known Allergies ? ?PAST MEDICAL HISTORY ?Past Medical History:  ?Diagnosis Date  ? Back pain   ? Diabetes mellitus without complication ([place])   ? Hypertension   ? Medical history unknown   ? Nuclear sclerotic cataract of left eye 07/06/2020  ? TBI (traumatic brain injury) ([place]) 12/2018  ? Type 2 diabetes mellitus with diabetic cataract, without long-term current use of insulin ([place]) 09/02/2020  ? ?History reviewed. No pertinent surgical history. ? ?FAMILY HISTORY ?Family History  ?Problem Relation Age of Onset  ? Cancer Mother   ?     Not clear of primary  ? Diabetes Mother   ? Hypertension Mother   ? Hypertension Father   ? Stroke Father   ? Hyperlipidemia Father   ? HIV/AIDS Sister   ? Cancer Brother   ?  Colon  ? Arthritis Sister   ? Diabetes Brother   ? ? ?SOCIAL HISTORY ?Social History  ? ?Tobacco Use  ? Smoking status: Never  ? Smokeless tobacco: Never  ?Substance Use Topics  ? Alcohol use: Not Currently  ?  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  Restarted and drinking 2 40 oz per week.  ? Drug use: No  ? ?  ? ?  ? ?OPHTHALMIC EXAM: ? ?Base Eye Exam   ? ? Visual Acuity (ETDRS)   ? ?   Right Left  ? Dist [place] 20/80 -2 20/25  ? Dist ph [place] 20/60 -2   ? ?  ?  ? ? Tonometry (Tonopen, 1:38 PM)   ? ?   Right  Left  ? Pressure 18 20  ? ?  ?  ? ? Pupils   ? ?   Pupils Dark Light APD  ? Right PERRL 4 3 None  ? Left PERRL 4 3 None  ? ?  ?  ? ? Extraocular Movement   ? ?   Right Left  ?  Full Full  ? ?  ?  ? ? Neuro/Psych   ? ? Oriented x3: Yes  ? Mood/Affect: Normal  ? ?  ?  ? ? Dilation   ? ? Both eyes: 1.0% Mydriacyl, 2.5% Phenylephrine @ 1:38 PM  ? ?  ?  ? ?  ? ?Slit Lamp and Fundus Exam   ? ? External Exam   ? ?   Right Left  ? External Normal Normal  ? ?  ?  ? ? Slit Lamp Exam   ? ?   Right Left  ? Lids/Lashes Normal Normal  ? Conjunctiva/Sclera White and quiet White and quiet  ? Cornea Clear Clear  ? Anterior Chamber Deep and quiet Deep and quiet  ? Iris Round and reactive Round and reactive  ? Lens Centered posterior chamber intraocular lens 3+ Nuclear sclerosis  ? Anterior Vitreous Normal Normal  ? ?  ?  ? ? Fundus Exam   ? ?   Right Left  ? Posterior Vitreous Normal, no posterior vitreous detachment Normal, no posterior vitreous detachment  ? Disc Normal Normal  ? C/D Ratio 0.35 0.35  ? Macula Moderate clinically significant macular edema no clinically significant macular edema, no drusen, no exudates, no macular thickening  ? Vessels NPDR-Severe NPDR-Severe  ? Periphery Normal Normal  ? ?  ?  ? ?  ? ? ?IMAGING AND PROCEDURES  ?Imaging and Procedures for 01/15/22 ? ?OCT, Retina - OU - Both Eyes   ? ?   ?Right Eye ?Quality was good. Scan locations included subfoveal. Central Foveal Thickness: 338. Progression has worsened. Findings include cystoid macular edema, vitreomacular adhesion , vitreous traction.  ? ?Left Eye ?Quality was good. Scan locations included subfoveal. Central Foveal Thickness: 260. Progression has been stable. Findings include cystoid macular edema.  ? ?Notes ?Massive CSME OD, from severe NPDR proved post recent focal laser treatment but residual VMA with center involved CSME. ? ? I will recommend observe the left eye at this time. ? ?  ? ?Color Fundus Photography Optos - OU - Both Eyes   ? ?    ?Right Eye ?Progression has no prior data. Disc findings include normal observations. Macula : microaneurysms, edema, exudates. Periphery : normal observations.  ? ?Left Eye ?Progression has no prior data. Disc findings include normal observations. Macula : microaneurysms. Periphery : normal observations.  ? ?  Notes ?OU with severe NPDR, CSME with exudates noted OD, less active ? ?  ? ? ?  ?  ? ?  ?ASSESSMENT/PLAN: ? ?Severe nonproliferative diabetic retinopathy of right eye, with macular edema, associated with type 2 diabetes mellitus ([place]) ?Center involved CSME vastly improved post injection into vegF late 2022 followed thereafter by focal laser yet center involved CSME on CT most likely from ongoing vitreomacular traction ? ?Vitreomacular traction, right ?Vitreomacular traction may cause vision loss from anatomic distortion to the center of the vision, the macula.  If visual function is symptomatic or threatened, therapy may be needed.  Surgical intervention offers the highest chance of visual stability and improvement.  Distortion of the macula anatomy may cause splitting of the retinal layers, termed foveomacular retinoschisis, which can cause more permanent vision loss.  Epiretinal membranes may also be associated.  Macular hole may also develop if vitreomacular traction progresses. ?The minor form of this condition is Vitreomacular adhesion, which is a natural change in the aging process of the eye, which requires observation only. ? ?OD was center involved CSME continues.  With decreased vision.  Not amenable to ongoing anti-VEGF therapy or focal laser treatment will offer and recommend vitrectomy right eye will add additional mid peripheral PRP for the underlying severe NPDR ? ?Severe nonproliferative diabetic retinopathy of left eye ([place]) ?Stable OS no macular edema  ? ?  ICD-10-CM   ?1. Severe nonproliferative diabetic retinopathy of right eye, with macular edema, associated with type 2 diabetes mellitus  ([place])  E11.3411 OCT, Retina - OU - Both Eyes  ?  Color Fundus Photography Optos - OU - Both Eyes  ?  ?2. Vitreomacular traction, right  H43.821   ?  ?3. Severe nonproliferative diabetic retinopathy of le

## 2022-01-15 NOTE — Assessment & Plan Note (Signed)
Center involved CSME vastly improved post injection into vegF late 2022 followed thereafter by focal laser yet center involved CSME on CT most likely from ongoing vitreomacular traction ?

## 2022-01-15 NOTE — Assessment & Plan Note (Signed)
Stable OS no macular edema ?

## 2022-01-15 NOTE — Assessment & Plan Note (Signed)
Vitreomacular traction may cause vision loss from anatomic distortion to the center of the vision, the macula.  If visual function is symptomatic or threatened, therapy may be needed.  Surgical intervention offers the highest chance of visual stability and improvement.  Distortion of the macula anatomy may cause splitting of the retinal layers, termed foveomacular retinoschisis, which can cause more permanent vision loss.  Epiretinal membranes may also be associated.  Macular hole may also develop if vitreomacular traction progresses. ?The minor form of this condition is Vitreomacular adhesion, which is a natural change in the aging process of the eye, which requires observation only. ? ?OD was center involved CSME continues.  With decreased vision.  Not amenable to ongoing anti-VEGF therapy or focal laser treatment will offer and recommend vitrectomy right eye will add additional mid peripheral PRP for the underlying severe NPDR ?

## 2022-01-18 ENCOUNTER — Encounter (INDEPENDENT_AMBULATORY_CARE_PROVIDER_SITE_OTHER): Payer: Self-pay

## 2022-01-18 ENCOUNTER — Ambulatory Visit (INDEPENDENT_AMBULATORY_CARE_PROVIDER_SITE_OTHER): Payer: Medicare PPO

## 2022-01-18 MED ORDER — OFLOXACIN 0.3 % OP SOLN: 1.0000 [drp] | Freq: Four times a day (QID) | OPHTHALMIC | 0 refills | Status: AC

## 2022-01-18 MED ORDER — PREDNISOLONE ACETATE 1 % OP SUSP: 1.0000 [drp] | Freq: Four times a day (QID) | OPHTHALMIC | 0 refills | Status: AC

## 2022-01-18 NOTE — Progress Notes (Signed)
? ? ?01/18/2022 ? ?  ? ?CHIEF COMPLAINT ?Patient presents for No chief complaint on file. ? ? ?HISTORY OF PRESENT ILLNESS: ?Brandon Sullivan is a 71 y.o. male who presents to the clinic today for:  ? ? ? ? ? ?HISTORICAL INFORMATION:  ? ?Selected notes from the [facility] ?  ? ?Lab Results  ?Component Value Date  ? HGBA1C 8.1 (H) 11/07/2021  ?  ? ?CURRENT MEDICATIONS: ?No current outpatient medications on file. (Brandon Sullivan)  ? ?No current facility-administered medications for this visit. (Brandon Sullivan)  ? ?Current Outpatient Medications (Other)  ?Medication Sig  ? ASPIRIN LOW DOSE 81 MG EC tablet TAKE 1 TABLET BY MOUTH EVERY DAY  ? atorvastatin (LIPITOR) 20 MG tablet TAKE 1 TABLET BY MOUTH EVERY DAY  ? blood glucose meter kit and supplies Dispense based on patient and insurance preference. Use up to four times daily as directed. (FOR ICD-10 E10.9, E11.9). (Patient not taking: Reported on 08/07/2021)  ? gabapentin (NEURONTIN) 100 MG capsule TAKE 1 CAPSULE BY MOUTH AT BEDTIME  ? losartan (COZAAR) 50 MG tablet TAKE 1 TABLET BY MOUTH EVERY MORNING WITH A MEAL (Patient taking differently: TAKE 1 TABLET BY MOUTH EVERY MORNING WITH A MEAL occasionally)  ? metFORMIN (GLUCOPHAGE) 1000 MG tablet TAKE 1 TABLET BY MOUTH 2 TIMES DAILY WITH A MEAL (Patient taking differently: Occasionally)  ? metoprolol tartrate (LOPRESSOR) 25 MG tablet TAKE 1 TABLET BY MOUTH 2 TIMES DAILY (Patient taking differently: Occasionally)  ? OneTouch Delica Lancets 85U MISC USE AS DIRECTED (Patient not taking: Reported on 03/01/2021)  ? Tea Tree Oil OIL Mix with [place] Foot cream and apply to feet but not in between toes after bathing (Patient not taking: Reported on 03/01/2021)  ? terbinafine (LAMISIL) 1 % cream Apply twice daily to feet but not in between toes for at least 14 days or until flaking resolved. (Patient not taking: Reported on 03/01/2021)  ? ?No current facility-administered medications for this visit. (Other)  ? ? ? ?ALLERGIES ?No  Known Allergies ? ?PAST MEDICAL HISTORY ?Past Medical History:  ?Diagnosis Date  ? Back pain   ? Diabetes mellitus without complication (Brandon Sullivan)   ? Hypertension   ? Medical history unknown   ? Nuclear sclerotic cataract of left eye 07/06/2020  ? TBI (traumatic brain injury) (Brandon Sullivan) 12/2018  ? Type 2 diabetes mellitus with diabetic cataract, without long-term current use of insulin (Brandon Sullivan) 09/02/2020  ? ?No past surgical history on file. ? ?FAMILY HISTORY ?Family History  ?Problem Relation Age of Onset  ? Cancer Mother   ?     Not clear of primary  ? Diabetes Mother   ? Hypertension Mother   ? Hypertension Father   ? Stroke Father   ? Hyperlipidemia Father   ? HIV/AIDS Sister   ? Cancer Brother   ?     Colon  ? Arthritis Sister   ? Diabetes Brother   ? ? ?SOCIAL HISTORY ?Social History  ? ?Tobacco Use  ? Smoking status: Never  ? Smokeless tobacco: Never  ?Substance Use Topics  ? Alcohol use: Not Currently  ?  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  Restarted and drinking 2 40 oz per week.  ? Drug use: No  ? ?  ? ?  ? ?Brandon EXAM: ? ?Base Eye Exam   ? ? Visual Acuity   ? ? ETDRS  ? ?  ?  ? ? Tonometry   ? ?  Tonopen  ? ?  ?  ? ? Pupils   ? ?   APD  ? Right None  ? Left None  ? ?  ?  ? ? Extraocular Movement   ? ?   Right Left  ?  Full Full  ? ?  ?  ? ? Neuro/Psych   ? ? Oriented x3: Yes  ? Mood/Affect: Normal  ? ?  ?  ? ? Dilation   ? ? Both eyes: No dilation @ 10:51 AM  ? ?  ?  ? ?  ? ? ?IMAGING AND PROCEDURES  ?Imaging and Procedures for @TODAY @ ? ? ? ?  ?  ? ?  ?ASSESSMENT/PLAN: ? ?No diagnosis found. ? ?Brandon Meds Ordered this visit:  ?No orders of the defined types were placed in this encounter. ? ? ?  ? ? ?Pre-op completed. Operative consent obtained with pre-op eye drops reviewed with Brandon Sullivan and sent via [facility] as needed. ?Post op instructions reviewed with patient and per patient all questions answered. ? ?Brandon Sullivan ? ? ? ? ? ?

## 2022-01-23 ENCOUNTER — Ambulatory Visit (INDEPENDENT_AMBULATORY_CARE_PROVIDER_SITE_OTHER): Payer: Medicare PPO

## 2022-01-25 ENCOUNTER — Encounter (INDEPENDENT_AMBULATORY_CARE_PROVIDER_SITE_OTHER): Payer: Medicare PPO | Admitting: Ophthalmology

## 2022-01-30 ENCOUNTER — Encounter (INDEPENDENT_AMBULATORY_CARE_PROVIDER_SITE_OTHER): Payer: Medicare PPO | Admitting: Ophthalmology

## 2022-02-15 ENCOUNTER — Emergency Department (HOSPITAL_COMMUNITY)
Admission: EM | Admit: 2022-02-15 | Discharge: 2022-02-16 | Discharge status: Against Medical Advice | Payer: Medicare PPO | Attending: Emergency Medicine | Admitting: Emergency Medicine

## 2022-02-15 ENCOUNTER — Encounter (HOSPITAL_COMMUNITY): Payer: Self-pay | Admitting: Emergency Medicine

## 2022-02-15 ENCOUNTER — Other Ambulatory Visit: Payer: Self-pay

## 2022-02-15 DIAGNOSIS — R350 Frequency of micturition: Secondary | ICD-10-CM | POA: Diagnosis not present

## 2022-02-15 DIAGNOSIS — Z5321 Procedure and treatment not carried out due to patient leaving prior to being seen by health care provider: Secondary | ICD-10-CM | POA: Diagnosis not present

## 2022-02-15 DIAGNOSIS — R739 Hyperglycemia, unspecified: Secondary | ICD-10-CM | POA: Insufficient documentation

## 2022-02-15 LAB — COMPREHENSIVE METABOLIC PANEL
ALT: 16 U/L (ref 0–44)
AST: 22 U/L (ref 15–41)
Albumin: 3.6 g/dL (ref 3.5–5.0)
Alkaline Phosphatase: 84 U/L (ref 38–126)
Anion gap: 8 (ref 5–15)
BUN: 19 mg/dL (ref 8–23)
CO2: 23 mmol/L (ref 22–32)
Calcium: 9.2 mg/dL (ref 8.9–10.3)
Chloride: 103 mmol/L (ref 98–111)
Creatinine, Ser: 2.05 mg/dL — ABNORMAL HIGH (ref 0.61–1.24)
GFR, Estimated: 34 mL/min — ABNORMAL LOW (ref >60)
Glucose, Bld: 341 mg/dL — ABNORMAL HIGH (ref 70–99)
Potassium: 4.9 mmol/L (ref 3.5–5.1)
Sodium: 134 mmol/L — ABNORMAL LOW (ref 135–145)
Total Bilirubin: 1.1 mg/dL (ref 0.3–1.2)
Total Protein: 7.3 g/dL (ref 6.5–8.1)

## 2022-02-15 LAB — CBC WITH DIFFERENTIAL/PLATELET
Abs Immature Granulocytes: 0.11 10*3/uL — ABNORMAL HIGH (ref 0.00–0.07)
Basophils Absolute: 0.1 10*3/uL (ref 0.0–0.1)
Basophils Relative: 0 %
Eosinophils Absolute: 0 10*3/uL (ref 0.0–0.5)
Eosinophils Relative: 0 %
HCT: 39.6 % (ref 39.0–52.0)
Hemoglobin: 13.5 g/dL (ref 13.0–17.0)
Immature Granulocytes: 1 %
Lymphocytes Relative: 8 %
Lymphs Abs: 1.4 10*3/uL (ref 0.7–4.0)
MCH: 27.7 pg (ref 26.0–34.0)
MCHC: 34.1 g/dL (ref 30.0–36.0)
MCV: 81.1 fL (ref 80.0–100.0)
Monocytes Absolute: 0.9 10*3/uL (ref 0.1–1.0)
Monocytes Relative: 5 %
Neutro Abs: 14.6 10*3/uL — ABNORMAL HIGH (ref 1.7–7.7)
Neutrophils Relative %: 86 %
Platelets: 206 10*3/uL (ref 150–400)
RBC: 4.88 MIL/uL (ref 4.22–5.81)
RDW: 13.5 % (ref 11.5–15.5)
WBC: 17 10*3/uL — ABNORMAL HIGH (ref 4.0–10.5)
nRBC: 0 % (ref 0.0–0.2)

## 2022-02-15 LAB — URINALYSIS, ROUTINE W REFLEX MICROSCOPIC
Bilirubin Urine: NEGATIVE
Glucose, UA: >=500 mg/dL — AB
Ketones, ur: NEGATIVE mg/dL
Nitrite: POSITIVE — AB
Protein, ur: 30 mg/dL — AB
Specific Gravity, Urine: 1.008 (ref 1.005–1.030)
WBC, UA: >50 WBC/hpf — ABNORMAL HIGH (ref 0–5)
pH: 5 (ref 5.0–8.0)

## 2022-02-15 LAB — CBG MONITORING, ED: Glucose-Capillary: 326 mg/dL — ABNORMAL HIGH (ref 70–99)

## 2022-02-15 NOTE — ED Notes (Signed)
Pt seen walking out with family member. Pt has not returned

## 2022-02-15 NOTE — ED Notes (Signed)
Pt called 3x no answer  

## 2022-02-15 NOTE — ED Triage Notes (Signed)
Patient's family reported elevated blood sugar at home CBG= 375 this evening with urinary frequency .

## 2022-02-15 NOTE — ED Provider Triage Note (Signed)
Emergency Medicine Provider Triage Evaluation Note  Boston Cookson , a 71 y.o. male  was evaluated in triage.  Pt complains of hyperglycemia.  Blood sugars in the 300s today.  Reports frequent urination.  No vomiting or fever.  Review of Systems  Positive: Frequent urination Negative: Vomiting, fever  Physical Exam  BP (!) 148/90 (BP Location: Left Arm)   Pulse 87   Temp 97.9 F (36.6 C)   Resp 17   SpO2 97%  Gen:   Awake, no distress   Resp:  Normal effort  MSK:   Moves extremities without difficulty  Other:  No acute distress noted  Medical Decision Making  Medically screening exam initiated at 8:33 PM.  Appropriate orders placed.  Milas Hock was informed that the remainder of the evaluation will be completed by another provider, this initial triage assessment does not replace that evaluation, and the importance of remaining in the ED until their evaluation is complete.  Labs ordered   Dietrich Pates, [place] 02/15/22 2034

## 2022-12-14 ENCOUNTER — Other Ambulatory Visit: Payer: Self-pay | Admitting: Internal Medicine

## 2022-12-17 NOTE — Telephone Encounter (Signed)
Patient has been schedule.

## 2023-01-25 ENCOUNTER — Other Ambulatory Visit: Payer: Self-pay | Admitting: Nurse Practitioner

## 2023-01-25 DIAGNOSIS — R0989 Other specified symptoms and signs involving the circulatory and respiratory systems: Secondary | ICD-10-CM

## 2023-02-11 ENCOUNTER — Ambulatory Visit: Payer: Medicare HMO | Admitting: Podiatry

## 2023-03-05 ENCOUNTER — Ambulatory Visit: Payer: Medicare HMO | Admitting: Internal Medicine

## 2023-04-25 ENCOUNTER — Other Ambulatory Visit: Payer: Self-pay | Admitting: Nurse Practitioner

## 2023-04-25 DIAGNOSIS — R0989 Other specified symptoms and signs involving the circulatory and respiratory systems: Secondary | ICD-10-CM

## 2023-05-08 ENCOUNTER — Inpatient Hospital Stay: Admission: RE | Admit: 2023-05-08 | Payer: Medicare PPO | Source: Ambulatory Visit

## 2023-05-22 ENCOUNTER — Encounter: Payer: Self-pay | Admitting: Podiatry

## 2023-05-22 ENCOUNTER — Ambulatory Visit (INDEPENDENT_AMBULATORY_CARE_PROVIDER_SITE_OTHER): Payer: Medicare PPO | Admitting: Podiatry

## 2023-05-22 DIAGNOSIS — M79674 Pain in right toe(s): Secondary | ICD-10-CM

## 2023-05-22 DIAGNOSIS — M79675 Pain in left toe(s): Secondary | ICD-10-CM | POA: Diagnosis not present

## 2023-05-22 DIAGNOSIS — E1142 Type 2 diabetes mellitus with diabetic polyneuropathy: Secondary | ICD-10-CM | POA: Diagnosis not present

## 2023-05-22 DIAGNOSIS — B351 Tinea unguium: Secondary | ICD-10-CM

## 2023-05-22 NOTE — Progress Notes (Signed)
This patient returns to my office for at risk foot care.  This patient requires this care by a professional since this patient will be at risk due to having diabetic neuropathy. This patient is unable to cut nails himself since the patient cannot reach his nails.These nails are painful walking and wearing shoes.  This patient presents for at risk foot care today. He is accompanied by his daughter.  General Appearance  Alert, conversant and in no acute stress.  Vascular  Dorsalis pedis and posterior tibial  pulses are  weakly palpable  bilaterally.  Capillary return is within normal limits  bilaterally. Temperature is within normal limits  bilaterally.  Neurologic  Senn-Weinstein monofilament wire test diminished   bilaterally. Muscle power within normal limits bilaterally.  Nails Thick disfigured discolored nails with subungual debris  from hallux to fifth toes bilaterally. No evidence of bacterial infection or drainage bilaterally.  Orthopedic  No limitations of motion  feet .  No crepitus or effusions noted.  No bony pathology or digital deformities noted.  Skin  normotropic skin with no porokeratosis noted bilaterally.  No signs of infections or ulcers noted.     Onychomycosis  Pain in right toes  Pain in left toes  Consent was obtained for treatment procedures.   Mechanical debridement of nails 1-5  bilaterally performed with a nail nipper.  Filed with dremel without incident.    Return office visit    prn                 Told patient to return for periodic foot care and evaluation due to potential at risk complications.   Helane Gunther DPM
